# Patient Record
Sex: Female | Born: 1960 | Race: White | Hispanic: No | Smoking: Never smoker
Health system: Southern US, Community
[De-identification: ages and names within clinical notes are randomized; demographics above are authoritative.]

## PROBLEM LIST (undated history)

## (undated) DIAGNOSIS — F32A Depression, unspecified: Secondary | ICD-10-CM

## (undated) DIAGNOSIS — G4762 Sleep related leg cramps: Secondary | ICD-10-CM

## (undated) DIAGNOSIS — I10 Essential (primary) hypertension: Secondary | ICD-10-CM

## (undated) DIAGNOSIS — G709 Myoneural disorder, unspecified: Secondary | ICD-10-CM

## (undated) DIAGNOSIS — G4730 Sleep apnea, unspecified: Secondary | ICD-10-CM

## (undated) DIAGNOSIS — E039 Hypothyroidism, unspecified: Secondary | ICD-10-CM

## (undated) DIAGNOSIS — F329 Major depressive disorder, single episode, unspecified: Secondary | ICD-10-CM

## (undated) DIAGNOSIS — H269 Unspecified cataract: Secondary | ICD-10-CM

## (undated) DIAGNOSIS — E8842 MERRF syndrome: Secondary | ICD-10-CM

## (undated) DIAGNOSIS — R55 Syncope and collapse: Secondary | ICD-10-CM

## (undated) DIAGNOSIS — K219 Gastro-esophageal reflux disease without esophagitis: Secondary | ICD-10-CM

## (undated) DIAGNOSIS — G40909 Epilepsy, unspecified, not intractable, without status epilepticus: Secondary | ICD-10-CM

## (undated) DIAGNOSIS — N319 Neuromuscular dysfunction of bladder, unspecified: Secondary | ICD-10-CM

## (undated) DIAGNOSIS — R251 Tremor, unspecified: Secondary | ICD-10-CM

## (undated) DIAGNOSIS — E785 Hyperlipidemia, unspecified: Secondary | ICD-10-CM

## (undated) DIAGNOSIS — T7840XA Allergy, unspecified, initial encounter: Secondary | ICD-10-CM

## (undated) DIAGNOSIS — M1990 Unspecified osteoarthritis, unspecified site: Secondary | ICD-10-CM

## (undated) DIAGNOSIS — G8929 Other chronic pain: Secondary | ICD-10-CM

## (undated) DIAGNOSIS — F419 Anxiety disorder, unspecified: Secondary | ICD-10-CM

## (undated) DIAGNOSIS — K760 Fatty (change of) liver, not elsewhere classified: Secondary | ICD-10-CM

## (undated) DIAGNOSIS — F482 Pseudobulbar affect: Secondary | ICD-10-CM

## (undated) DIAGNOSIS — D649 Anemia, unspecified: Secondary | ICD-10-CM

## (undated) DIAGNOSIS — M8580 Other specified disorders of bone density and structure, unspecified site: Secondary | ICD-10-CM

## (undated) DIAGNOSIS — I951 Orthostatic hypotension: Secondary | ICD-10-CM

## (undated) DIAGNOSIS — M549 Dorsalgia, unspecified: Secondary | ICD-10-CM

## (undated) DIAGNOSIS — K5790 Diverticulosis of intestine, part unspecified, without perforation or abscess without bleeding: Secondary | ICD-10-CM

## (undated) DIAGNOSIS — Z860100 Personal history of colon polyps, unspecified: Secondary | ICD-10-CM

## (undated) DIAGNOSIS — G40409 Other generalized epilepsy and epileptic syndromes, not intractable, without status epilepticus: Secondary | ICD-10-CM

## (undated) DIAGNOSIS — G250 Essential tremor: Secondary | ICD-10-CM

## (undated) DIAGNOSIS — G40309 Generalized idiopathic epilepsy and epileptic syndromes, not intractable, without status epilepticus: Secondary | ICD-10-CM

## (undated) HISTORY — PX: COLONOSCOPY: SHX174

## (undated) HISTORY — DX: Pseudobulbar affect: F48.2

## (undated) HISTORY — PX: POLYPECTOMY: SHX149

## (undated) HISTORY — PX: NERVE SURGERY: SHX1016

## (undated) HISTORY — DX: Anemia, unspecified: D64.9

## (undated) HISTORY — DX: Neuromuscular dysfunction of bladder, unspecified: N31.9

## (undated) HISTORY — PX: DEEP BRAIN STIMULATOR PLACEMENT: SHX608

## (undated) HISTORY — DX: Other chronic pain: G89.29

## (undated) HISTORY — DX: Sleep related leg cramps: G47.62

## (undated) HISTORY — DX: Hypothyroidism, unspecified: E03.9

## (undated) HISTORY — PX: TONSILLECTOMY: SUR1361

## (undated) HISTORY — DX: Major depressive disorder, single episode, unspecified: F32.9

## (undated) HISTORY — DX: Epilepsy, unspecified, not intractable, without status epilepticus: G40.909

## (undated) HISTORY — DX: Unspecified cataract: H26.9

## (undated) HISTORY — PX: SPINAL CORD STIMULATOR IMPLANT: SHX2422

## (undated) HISTORY — DX: Anxiety disorder, unspecified: F41.9

## (undated) HISTORY — DX: Hyperlipidemia, unspecified: E78.5

## (undated) HISTORY — PX: TUBAL LIGATION: SHX77

## (undated) HISTORY — DX: Sleep apnea, unspecified: G47.30

## (undated) HISTORY — DX: Personal history of colon polyps, unspecified: Z86.0100

## (undated) HISTORY — DX: Syncope and collapse: R55

## (undated) HISTORY — DX: Other specified disorders of bone density and structure, unspecified site: M85.80

## (undated) HISTORY — DX: Generalized idiopathic epilepsy and epileptic syndromes, not intractable, without status epilepticus: G40.309

## (undated) HISTORY — DX: Fatty (change of) liver, not elsewhere classified: K76.0

## (undated) HISTORY — DX: Essential tremor: G25.0

## (undated) HISTORY — DX: Tremor, unspecified: R25.1

## (undated) HISTORY — PX: ABDOMINAL HYSTERECTOMY: SUR658

## (undated) HISTORY — DX: Depression, unspecified: F32.A

## (undated) HISTORY — DX: Orthostatic hypotension: I95.1

## (undated) HISTORY — PX: UPPER GASTROINTESTINAL ENDOSCOPY: SHX188

## (undated) HISTORY — DX: Personal history of colonic polyps: Z86.010

## (undated) HISTORY — DX: Unspecified osteoarthritis, unspecified site: M19.90

## (undated) HISTORY — DX: Other generalized epilepsy and epileptic syndromes, not intractable, without status epilepticus: G40.409

## (undated) HISTORY — DX: MERRF syndrome: E88.42

## (undated) HISTORY — DX: Diverticulosis of intestine, part unspecified, without perforation or abscess without bleeding: K57.90

## (undated) HISTORY — DX: Gastro-esophageal reflux disease without esophagitis: K21.9

## (undated) HISTORY — DX: Essential (primary) hypertension: I10

## (undated) HISTORY — DX: Myoneural disorder, unspecified: G70.9

## (undated) HISTORY — DX: Allergy, unspecified, initial encounter: T78.40XA

## (undated) HISTORY — DX: Dorsalgia, unspecified: M54.9

## (undated) HISTORY — PX: CARPAL TUNNEL RELEASE: SHX101

---

## 1996-04-13 HISTORY — PX: OTHER SURGICAL HISTORY: SHX169

## 2002-12-26 ENCOUNTER — Ambulatory Visit (HOSPITAL_COMMUNITY): Admission: RE | Admit: 2002-12-26 | Discharge: 2002-12-26 | Payer: Self-pay | Admitting: Internal Medicine

## 2002-12-26 ENCOUNTER — Encounter: Payer: Self-pay | Admitting: Internal Medicine

## 2003-04-14 HISTORY — PX: GASTRIC BYPASS: SHX52

## 2003-04-25 ENCOUNTER — Inpatient Hospital Stay (HOSPITAL_COMMUNITY): Admission: EM | Admit: 2003-04-25 | Discharge: 2003-04-27 | Payer: Self-pay | Admitting: Emergency Medicine

## 2008-04-13 HISTORY — PX: PANNICULECTOMY: SUR1001

## 2008-12-19 ENCOUNTER — Encounter (INDEPENDENT_AMBULATORY_CARE_PROVIDER_SITE_OTHER): Payer: Self-pay | Admitting: *Deleted

## 2009-05-10 ENCOUNTER — Encounter: Admission: RE | Admit: 2009-05-10 | Discharge: 2009-05-10 | Payer: Self-pay | Admitting: Neurological Surgery

## 2009-05-31 ENCOUNTER — Telehealth: Payer: Self-pay | Admitting: Internal Medicine

## 2009-08-21 ENCOUNTER — Encounter: Admission: RE | Admit: 2009-08-21 | Discharge: 2009-08-21 | Payer: Self-pay | Admitting: Neurological Surgery

## 2010-05-13 NOTE — Progress Notes (Signed)
Summary: Schedule Colonoscopy  Phone Note Outgoing Call   Call placed by: Hortense Ramal CMA Duncan Dull),  May 31, 2009 10:58 AM Call placed to: Patient Summary of Call: Patient is due for a recall colonoscopy due to her strong family history of colon cancer. I have left a message for the patient to call back.  Initial call taken by: Hortense Ramal CMA Duncan Dull),  May 31, 2009 11:02 AM  Follow-up for Phone Call        I have left a message for the patient to call back. Hortense Ramal CMA Duncan Dull)  June 04, 2009 10:24 AM   Additional Follow-up for Phone Call Additional follow up Details #1::        We have not gotten a return call from patient. We will send a letter.  Additional Follow-up by: Hortense Ramal CMA Duncan Dull),  June 06, 2009 11:52 AM

## 2010-05-14 ENCOUNTER — Observation Stay (HOSPITAL_COMMUNITY)
Admission: EM | Admit: 2010-05-14 | Discharge: 2010-05-15 | Disposition: A | Discharge status: Home / Self Care | Payer: Medicaid Other | Attending: Cardiology | Admitting: Cardiology

## 2010-05-14 ENCOUNTER — Emergency Department (HOSPITAL_COMMUNITY): Payer: Medicaid Other

## 2010-05-14 DIAGNOSIS — Z9884 Bariatric surgery status: Secondary | ICD-10-CM | POA: Insufficient documentation

## 2010-05-14 DIAGNOSIS — R0989 Other specified symptoms and signs involving the circulatory and respiratory systems: Secondary | ICD-10-CM | POA: Insufficient documentation

## 2010-05-14 DIAGNOSIS — M4800 Spinal stenosis, site unspecified: Secondary | ICD-10-CM | POA: Insufficient documentation

## 2010-05-14 DIAGNOSIS — G40909 Epilepsy, unspecified, not intractable, without status epilepticus: Secondary | ICD-10-CM | POA: Insufficient documentation

## 2010-05-14 DIAGNOSIS — F329 Major depressive disorder, single episode, unspecified: Secondary | ICD-10-CM | POA: Insufficient documentation

## 2010-05-14 DIAGNOSIS — E039 Hypothyroidism, unspecified: Secondary | ICD-10-CM | POA: Insufficient documentation

## 2010-05-14 DIAGNOSIS — E785 Hyperlipidemia, unspecified: Secondary | ICD-10-CM | POA: Insufficient documentation

## 2010-05-14 DIAGNOSIS — I10 Essential (primary) hypertension: Secondary | ICD-10-CM | POA: Insufficient documentation

## 2010-05-14 DIAGNOSIS — R0609 Other forms of dyspnea: Secondary | ICD-10-CM | POA: Insufficient documentation

## 2010-05-14 DIAGNOSIS — R079 Chest pain, unspecified: Principal | ICD-10-CM | POA: Insufficient documentation

## 2010-05-14 DIAGNOSIS — G8929 Other chronic pain: Secondary | ICD-10-CM | POA: Insufficient documentation

## 2010-05-14 DIAGNOSIS — E119 Type 2 diabetes mellitus without complications: Secondary | ICD-10-CM | POA: Insufficient documentation

## 2010-05-14 DIAGNOSIS — F3289 Other specified depressive episodes: Secondary | ICD-10-CM | POA: Insufficient documentation

## 2010-05-14 LAB — CBC
HCT: 34.6 % — ABNORMAL LOW (ref 36.0–46.0)
Hemoglobin: 11.6 g/dL — ABNORMAL LOW (ref 12.0–15.0)
MCHC: 33.5 g/dL (ref 30.0–36.0)
RBC: 3.67 MIL/uL — ABNORMAL LOW (ref 3.87–5.11)
WBC: 5.5 10*3/uL (ref 4.0–10.5)

## 2010-05-14 LAB — BASIC METABOLIC PANEL
CO2: 26 mEq/L (ref 19–32)
Chloride: 107 mEq/L (ref 96–112)
Glucose, Bld: 111 mg/dL — ABNORMAL HIGH (ref 70–99)
Potassium: 4.5 mEq/L (ref 3.5–5.1)
Sodium: 142 mEq/L (ref 135–145)

## 2010-05-14 LAB — DIFFERENTIAL
Basophils Absolute: 0 10*3/uL (ref 0.0–0.1)
Lymphocytes Relative: 23 % (ref 12–46)
Monocytes Absolute: 0.4 10*3/uL (ref 0.1–1.0)
Neutro Abs: 3.7 10*3/uL (ref 1.7–7.7)

## 2010-05-14 LAB — TROPONIN I: Troponin I: 0.02 ng/mL (ref 0.00–0.06)

## 2010-05-14 LAB — POCT CARDIAC MARKERS: CKMB, poc: <1 ng/mL — ABNORMAL LOW (ref 1.0–8.0)

## 2010-05-14 LAB — CK TOTAL AND CKMB (NOT AT ARMC): CK, MB: 1.2 ng/mL (ref 0.3–4.0)

## 2010-05-15 ENCOUNTER — Inpatient Hospital Stay (HOSPITAL_COMMUNITY): Payer: Medicaid Other

## 2010-05-15 DIAGNOSIS — R079 Chest pain, unspecified: Secondary | ICD-10-CM

## 2010-05-15 LAB — BASIC METABOLIC PANEL
Calcium: 9 mg/dL (ref 8.4–10.5)
Creatinine, Ser: 0.73 mg/dL (ref 0.4–1.2)
GFR calc Af Amer: >60 mL/min (ref >60)
GFR calc non Af Amer: >60 mL/min (ref >60)
Sodium: 142 mEq/L (ref 135–145)

## 2010-05-15 LAB — CBC
Hemoglobin: 11.3 g/dL — ABNORMAL LOW (ref 12.0–15.0)
Platelets: 159 10*3/uL (ref 150–400)
RBC: 3.56 MIL/uL — ABNORMAL LOW (ref 3.87–5.11)

## 2010-05-15 LAB — LIPID PANEL
Cholesterol: 216 mg/dL — ABNORMAL HIGH (ref 0–200)
HDL: 35 mg/dL — ABNORMAL LOW (ref >39)
LDL Cholesterol: 127 mg/dL — ABNORMAL HIGH (ref 0–99)
Total CHOL/HDL Ratio: 6.2 RATIO

## 2010-05-15 LAB — D-DIMER, QUANTITATIVE: D-Dimer, Quant: <0.22 ug/mL-FEU (ref 0.00–0.48)

## 2010-05-15 MED ORDER — TECHNETIUM TC 99M TETROFOSMIN IV KIT
30.0000 | PACK | Freq: Once | INTRAVENOUS | Status: AC | PRN
Start: 2010-05-15 — End: 2010-05-15
  Administered 2010-05-15: 30 via INTRAVENOUS

## 2010-05-15 MED ORDER — TECHNETIUM TC 99M TETROFOSMIN IV KIT
10.0000 | PACK | Freq: Once | INTRAVENOUS | Status: AC | PRN
  Administered 2010-05-15: 10 via INTRAVENOUS

## 2010-05-16 ENCOUNTER — Inpatient Hospital Stay (HOSPITAL_COMMUNITY): Payer: Self-pay

## 2010-06-20 NOTE — Discharge Summary (Signed)
NAMEZERAH, HILYER               ACCOUNT NO.:  [id number]  MEDICAL RECORD NO.:  [id number]           PATIENT TYPE:  I  LOCATION:  2020                         FACILITY:  [facility]  PHYSICIAN:  Pricilla Riffle, MD, FACCDATE OF BIRTH:  October 15, 1960  DATE OF ADMISSION:  05/14/2010 DATE OF DISCHARGE:  05/15/2010                              DISCHARGE SUMMARY   PRIMARY CARDIOLOGIST:  Rollene Rotunda, MD, [facility]  PRIMARY CARE PROVIDER:  Tera Mater. [place] Martin, MD  DISCHARGE DIAGNOSIS:  Chest pain without objective evidence of ischemia.  SECONDARY DIAGNOSES: 1. History of normal coronary arteries by catheterization 2005. 2. Seizure disorder. 3. History of obesity status post a gastric bypass. 4. Diabetes mellitus. 5. Hyperlipidemia. 6. Depression. 7. History of chronic back pain and spinal stenosis. 8. Hypothyroidism. 9. History of palpitations. 10.History of hypertension. 11.History of neck fusion. 12.Status post hysterectomy and tubal ligation.  ALLERGIES:  SULFA and STATINS.  PROCEDURES:  Lexiscan Myoview showing EF of 69% with probable normal perfusion and soft tissue attenuation.  Overall, low-risk scan.  HISTORY OF PRESENT ILLNESS:  A 50 year old female with the above problem list who presented to the [facility] ED on May 14, 2010, secondary to an episode of retrosternal chest pain radiating to her neck and jaw on the right side lasting about 40 minutes and resolving with 2 sublingual nitroglycerin tablets.  She was seen in the [facility] ED where enzymes were negative and EKG showed no acute changes.  She was admitted for further evaluation.  HOSPITAL COURSE:  The patient ruled out for MI.  She had no further chest pain.  She does have chronic back pain.  She underwent Lexiscan Myoview this morning, which is low risk without evidence for ischemia. The patient will be discharged home today in good condition.  DISCHARGE LABORATORY DATA:  Hemoglobin 11.3, hematocrit 33.4, WBC  4.7, platelets 159.  Sodium 142, potassium 4.3, chloride 107, CO2 of 28, BUN 13, creatinine 0.73, glucose 89, calcium 9.0, hemoglobin A1c 6.2, CK 27, MB 1.2, troponin I 0.02, total cholesterol 216, triglycerides 278, HDL 35, LDL 127.  DISPOSITION:  The patient will be discharged home today in good condition.  FOLLOWUP PLANS AND APPOINTMENTS:  She does have a followup with Dr. Evlyn Kanner in the next 1-2 weeks.  DISCHARGE MEDICATIONS: 1. Zantac 150 mg b.i.d. 2. Carbamazepine 200 mg b.i.d. 3. Cyclobenzaprine 10 mg at bedtime. 4. Cymbalta 90 mg q.p.m. 5. Endocet 2 tablets at bedtime. 6. Iron over the counter 1 tablet b.i.d. 7. Levothyroxine 100 mcg daily. 8. Lotrisone cream daily. 9. Methadone 10 mg 2 tablets b.i.d. 10.Nadolol 40 mg daily. 11.Nitroglycerin p.r.n. 12.Pristiq XR 100 mg daily. 13.Valacyclovir 500 mg q.p.m. 14.Ventolin inhaler 120 mcg per inhalation 2 puffs q.6 h. p.r.n. 15.Vitamin D2 50,000 units twice a week. 16.Zolpidem 10 mg every 2 days as needed.  OUTSTANDING LABORATORY STUDIES:  None.  DURATION OF DISCHARGE ENCOUNTER:  40 minutes including physician time.     Nicolasa Ducking, ANP   ______________________________ Pricilla Riffle, MD, [facility]    CB/MEDQ  D:  05/15/2010  T:  05/16/2010  Job:  [id number]  cc:  Tera Mater. Evlyn Kanner, M.D.  Electronically Signed by Nicolasa Ducking ANP on 05/16/2010 04:23:34 PM Electronically Signed by Dietrich Pates MD [facility] on 06/19/2010 02:11:11 PM

## 2010-06-26 NOTE — H&P (Signed)
[name], [name]               ACCOUNT NO.:  [id number]  MEDICAL RECORD NO.:  [id number]           PATIENT TYPE:  E  LOCATION:  [facility]                         FACILITY:  [facility]  PHYSICIAN:  Rollene Rotunda, MD, FACCDATE OF BIRTH:  1960/08/31  DATE OF ADMISSION:  05/14/2010 DATE OF DISCHARGE:                             HISTORY & PHYSICAL   PRIMARY CARDIOLOGIST:  Rollene Rotunda, MD, [facility]  PRIMARY CARE PHYSICIAN:  Tera Mater. [place] Martin, MD  CHIEF COMPLAINT:  Chest pain.  HISTORY OF PRESENT ILLNESS:  This is a 50 year old female with a history of dyslipidemia, early coronary artery disease in her family who had a cardiac catheterization in 2005 that showed normal coronary arteries and since that time has undergone gastric bypass surgery and lost 125 pounds.  The patient now has her diabetes under control as well as her hypertension under control.  She states since that time she has been doing well until approximately 3 months ago when she had onset of retrosternal chest pain lasting approximately 1 hour, it was relieved on its own.  There were no associated symptoms of nausea/vomiting or diaphoresis.  The patient has had several episodes of chest pain since that time occurring at rest, this lasted several minutes and relieved on its own.    On day of admission, the patient was sitting at the computer this morning when she began to have retrosternal chest pain radiating to her right shoulder and right jaw.  The patient began to get dressed and her pain worsened. She took 2 sublingual nitroglycerins without relief.  On the way to the emergency department, her pain subsided.  She is currently chest pain- free.  Of note, the patient was complaining of palpitations yesterday, this is not a new symptom but the patient states that it has been a while since she has felt any palpitations.  The patient denies any associated symptoms of nausea/vomiting, diaphoresis or shortness of breath.  She  denies any recent fever/chills, change in bowel habits or change in appetite.  Cardiology was asked to evaluate the patient for further recommendations.  PAST MEDICAL HISTORY: 1. Abnormal Cardiolite status post cardiac catheterization in 2005     demonstrating normal coronaries with an ejection fraction of 70%. 2. Seizure disorder. 3. History of diabetes mellitus, diet controlled. 4. Dyslipidemia. 5. Depression. 6. Chronic pain. 7. Spinal stenosis. 8. Palpitations. 9. Hypertension. 10.Hypothyroidism. 11.Positive sedentary lifestyle 12.Gastric bypass surgery in 2005. 13.Status post neck fusion. 14.Status post hysterectomy.  SOCIAL HISTORY:  The patient lives in [place].  She is disabled secondary to her back.  She denies any tobacco use.  She rarely consumes alcohol.  She denies any illicit drug use.  She does not exercise currently.  FAMILY HISTORY:  Pertinent for early coronary artery disease.  Her mother had coronary artery bypass grafting at age 21.  Her father had an initial myocardial infarction at the age of 39 and passed away at the age of 22.  She has two sisters without known coronary artery disease but cholesterol issues.  ALLERGIES: 1. SULFA. 2. STATINS causing headache.  HOME MEDICATIONS: 1. Nitroglycerin 0.4 mg spray. 2.  Nadolol 40 mg daily. 3. Zolpidem 10 mg as needed for sleep. 4. Endocet 5/325 mg 3 times a day. 5. Methadone 20 mg twice daily. 6. Cymbalta 90 mg daily. 7. Pristiq 100 mg daily. 8. Valcyclovir 500 mg daily. 9. Epitol 200 mg twice daily. 10.Cyclobenzaprine 10 mg twice daily. 11.Synthroid 100 mcg daily.  REVIEW OF SYSTEMS:  All pertinent positives and negatives as stated in HPI.  All other systems have been reviewed and are negative.  CODE STATUS:  Full.  PHYSICAL EXAMINATION:  VITAL SIGNS:  Temperature 98.5, pulse 68, respirations 20, blood pressure 107/61, and O2 saturation 99% on room air. GENERAL:  This is a polite,  well-developed, well-nourished middle-aged female.  She is in no acute distress. HEENT:  Normal. NECK:  Supple without bruit or JVD. HEART:  Regular rate and rhythm with S1-S2.  No murmur, rub, or gallop noted.  PMI is normal.  Pulses are 2+ and equal bilaterally. LUNGS:  Clear to auscultation bilaterally without wheezes, rales, or rhonchi. ABDOMEN:  Soft, nontender, positive bowel sounds x4. EXTREMITIES:  No clubbing, cyanosis, or edema.  There is no calf tenderness or redness bilaterally. MUSCULOSKELETAL:  No joint deformities or effusions. NEURO:  Alert and oriented x3, cranial nerves II through XII grossly intact.  IMAGING:  Chest x-ray showing no acute cardiopulmonary process.  EKG showing normal sinus rhythm at a rate of 68 beats per minute.  Axis is normal.  Intervals are normal.  There are no acute ST-T wave changes.  LABORATORY DATA:  WBC of 5.5, hemoglobin 11.6, hematocrit 34.6, and platelets 176.  Sodium 142, potassium 4.5, chloride 107, bicarb 26, BUN 16, and creatinine 0.66.  Point-of-care markers negative x1.  ASSESSMENT AND PLAN:  This is a 50 year old female with normal coronary arteries per cardiac catheterization in 2005 who presents with chest pain has atypical greater than typical features.  Her cardiac risk factors do include a positive family history for early coronary artery disease, sedentary lifestyle, and dyslipidemia.  Her EKG is without acute changes and her initial point-of-care markers are negative x1. The patient will be admitted to telemetry and we will cycle enzymes to rule out for myocardial infarction.  She will be continued on nadolol, and aspirin will be added.  No statin will be initiated at this time secondary to intolerance and further cholesterol management will be deferred to her endocrinologist, Dr. Evlyn Kanner.  We will initiate heparin at this time unless enzymes become positive.  If cardiac enzymes remain negative, we will proceed with  Lexiscan Myoview in the morning.  Cardiac enzymes become positive and we will plan for cardiac catheterization. Further treatment will be dependent upon the above.     Leonette Monarch, PA-C   ______________________________ Rollene Rotunda, MD, [facility]    NB/MEDQ  D:  05/14/2010  T:  05/15/2010  Job:  [id number]  cc:   Rollene Rotunda, MD, [facility] Tera Mater. Evlyn Kanner, M.D.  Electronically Signed by Alen Blew P.A. on 05/20/2010 06:29:15 AM Electronically Signed by Rollene Rotunda MD [facility] on 06/26/2010 09:52:09 AM

## 2010-08-12 ENCOUNTER — Telehealth: Payer: Self-pay | Admitting: Internal Medicine

## 2010-08-12 NOTE — Telephone Encounter (Signed)
Appt made for pt to see Amy Esterwood PA 08/14/10@11am . Malachi Bonds to notify pt of appt date and time. Pt having problems with diarrhea and dysphagia. Malachi Bonds to fax records. Pt was due for colon last year and did not schedule.

## 2010-08-14 ENCOUNTER — Ambulatory Visit (INDEPENDENT_AMBULATORY_CARE_PROVIDER_SITE_OTHER): Payer: Medicaid Other | Admitting: Physician Assistant

## 2010-08-14 ENCOUNTER — Telehealth: Payer: Self-pay | Admitting: Physician Assistant

## 2010-08-14 ENCOUNTER — Encounter: Payer: Self-pay | Admitting: Physician Assistant

## 2010-08-14 ENCOUNTER — Other Ambulatory Visit: Payer: Medicaid Other

## 2010-08-14 DIAGNOSIS — R1013 Epigastric pain: Secondary | ICD-10-CM

## 2010-08-14 DIAGNOSIS — Z9884 Bariatric surgery status: Secondary | ICD-10-CM

## 2010-08-14 DIAGNOSIS — F329 Major depressive disorder, single episode, unspecified: Secondary | ICD-10-CM

## 2010-08-14 DIAGNOSIS — J45909 Unspecified asthma, uncomplicated: Secondary | ICD-10-CM

## 2010-08-14 DIAGNOSIS — R1310 Dysphagia, unspecified: Secondary | ICD-10-CM

## 2010-08-14 DIAGNOSIS — G40909 Epilepsy, unspecified, not intractable, without status epilepticus: Secondary | ICD-10-CM

## 2010-08-14 DIAGNOSIS — R197 Diarrhea, unspecified: Secondary | ICD-10-CM

## 2010-08-14 DIAGNOSIS — E785 Hyperlipidemia, unspecified: Secondary | ICD-10-CM

## 2010-08-14 DIAGNOSIS — G4730 Sleep apnea, unspecified: Secondary | ICD-10-CM

## 2010-08-14 DIAGNOSIS — M4800 Spinal stenosis, site unspecified: Secondary | ICD-10-CM

## 2010-08-14 DIAGNOSIS — E119 Type 2 diabetes mellitus without complications: Secondary | ICD-10-CM

## 2010-08-14 LAB — CBC WITH DIFFERENTIAL/PLATELET
Basophils Relative: 1 % (ref 0–1)
Eosinophils Absolute: 0.2 10*3/uL (ref 0.0–0.7)
HCT: 34.9 % — ABNORMAL LOW (ref 36.0–46.0)
Hemoglobin: 11.8 g/dL — ABNORMAL LOW (ref 12.0–15.0)
MCH: 32 pg (ref 26.0–34.0)
MCHC: 33.8 g/dL (ref 30.0–36.0)
Monocytes Absolute: 0.4 10*3/uL (ref 0.1–1.0)
Monocytes Relative: 7 % (ref 3–12)

## 2010-08-14 MED ORDER — PEG-KCL-NACL-NASULF-NA ASC-C 100 G PO SOLR: 1.0000 | Freq: Once | ORAL | Status: AC

## 2010-08-14 MED ORDER — HYOSCYAMINE SULFATE 0.125 MG SL SUBL: 0.1250 mg | SUBLINGUAL_TABLET | Freq: Three times a day (TID) | SUBLINGUAL | Status: DC

## 2010-08-14 NOTE — Telephone Encounter (Signed)
I called the pharmacy and they just got my order for the Moviprep and the Levsin SL. I verified I had the correct information.

## 2010-08-14 NOTE — Patient Instructions (Signed)
Please go to the basement level to have your labs drawn and stool studies. We scheduled the Ultrasound. Directions provided. We scheduled the Endoscopy and Colonoscopy with Dr. Yancey Flemings with Propofol sedation. We have given you samples of Nexium 40mg . Take 1 daily 30 min before breakfast. We sent prescriptions for the colonoscopy prep and Levsin sublinguel for cramping and spasms to [facility].

## 2010-08-15 ENCOUNTER — Telehealth: Payer: Self-pay

## 2010-08-15 ENCOUNTER — Encounter: Payer: Self-pay | Admitting: Physician Assistant

## 2010-08-15 DIAGNOSIS — Z9884 Bariatric surgery status: Secondary | ICD-10-CM | POA: Insufficient documentation

## 2010-08-15 DIAGNOSIS — G40909 Epilepsy, unspecified, not intractable, without status epilepticus: Secondary | ICD-10-CM | POA: Insufficient documentation

## 2010-08-15 DIAGNOSIS — E119 Type 2 diabetes mellitus without complications: Secondary | ICD-10-CM | POA: Insufficient documentation

## 2010-08-15 DIAGNOSIS — G4730 Sleep apnea, unspecified: Secondary | ICD-10-CM | POA: Insufficient documentation

## 2010-08-15 DIAGNOSIS — F32A Depression, unspecified: Secondary | ICD-10-CM | POA: Insufficient documentation

## 2010-08-15 DIAGNOSIS — M4800 Spinal stenosis, site unspecified: Secondary | ICD-10-CM | POA: Insufficient documentation

## 2010-08-15 DIAGNOSIS — E785 Hyperlipidemia, unspecified: Secondary | ICD-10-CM | POA: Insufficient documentation

## 2010-08-15 DIAGNOSIS — F329 Major depressive disorder, single episode, unspecified: Secondary | ICD-10-CM | POA: Insufficient documentation

## 2010-08-15 DIAGNOSIS — J45909 Unspecified asthma, uncomplicated: Secondary | ICD-10-CM | POA: Insufficient documentation

## 2010-08-15 NOTE — Telephone Encounter (Signed)
Message copied by Chrystie Nose on Fri Aug 15, 2010  2:11 PM ------      Message from: [place], [place]      Created: Fri Aug 15, 2010  1:51 PM       Please let Kasmira know her labs are fine hgb is 11.8  Which is stable

## 2010-08-15 NOTE — Progress Notes (Signed)
Reviewed and agree with management. Rishav Rockefeller T. Tache Bobst MD FACG 

## 2010-08-15 NOTE — Telephone Encounter (Signed)
Pt aware of results per Amy Esterwood PA. 
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
## 2010-08-18 ENCOUNTER — Ambulatory Visit (HOSPITAL_COMMUNITY)
Admission: RE | Admit: 2010-08-18 | Discharge: 2010-08-18 | Disposition: A | Discharge status: Home / Self Care | Payer: Medicaid Other | Source: Ambulatory Visit | Attending: Physician Assistant | Admitting: Physician Assistant

## 2010-08-18 DIAGNOSIS — R1013 Epigastric pain: Secondary | ICD-10-CM

## 2010-08-18 DIAGNOSIS — R1310 Dysphagia, unspecified: Secondary | ICD-10-CM

## 2010-08-18 DIAGNOSIS — Q638 Other specified congenital malformations of kidney: Secondary | ICD-10-CM | POA: Insufficient documentation

## 2010-08-19 ENCOUNTER — Other Ambulatory Visit: Payer: Medicaid Other

## 2010-08-19 ENCOUNTER — Telehealth: Payer: Self-pay

## 2010-08-19 DIAGNOSIS — R1310 Dysphagia, unspecified: Secondary | ICD-10-CM

## 2010-08-19 DIAGNOSIS — R1013 Epigastric pain: Secondary | ICD-10-CM

## 2010-08-19 DIAGNOSIS — R197 Diarrhea, unspecified: Secondary | ICD-10-CM

## 2010-08-19 NOTE — Telephone Encounter (Signed)
Message copied by Chrystie Nose on Tue Aug 19, 2010  4:09 PM ------      Message from: [place], [place]      Created: Tue Aug 19, 2010  4:09 PM       PLEASE LET PT KNOW THE ABDOMINAL US IS NEGATIVE, NO GALLSTONES.

## 2010-08-19 NOTE — Telephone Encounter (Signed)
Pt aware of results per Amy Esterwood PA. 

## 2010-08-20 LAB — FECAL LACTOFERRIN, QUANT: Lactoferrin: POSITIVE

## 2010-08-26 ENCOUNTER — Telehealth: Payer: Self-pay | Admitting: *Deleted

## 2010-08-26 NOTE — Telephone Encounter (Signed)
Patient given results of stool cultures. She states she thought she was doing better but she had an episode last night after she ate chili.

## 2010-08-26 NOTE — Telephone Encounter (Signed)
Message copied by Jesse Fall on Tue Aug 26, 2010  8:46 AM ------      Message from: [place], [place]      Created: Mon Aug 25, 2010 10:29 AM       plese make sure pt knows that all of the stool cultures are negative,see how she is feeling

## 2010-08-29 NOTE — H&P (Signed)
NAME:  [name], [name]:  [id number]   MEDICAL RECORD NO.:  [id number]                   PATIENT TYPE:  INP   LOCATION:  2906                                 FACILITY:  [facility]   PHYSICIAN:  Willa Rough, M.D.                  DATE OF BIRTH:  1960-09-05   DATE OF ADMISSION:  04/25/2003  DATE OF DISCHARGE:                                HISTORY & PHYSICAL   [name] is a very pleasant 50 year old female who is admitted with  chest pain.  She has diabetes, and her endocrinologist is Dr. Ardyth Harps  here in [place].  The patient has a very strong family history of heart  disease.  Her father was cared for by Dr. Antoine Poche of our group, until he  passed away earlier this year.  The patient does have diabetes,  hypertension, and elevated cholesterol along with her family history.  A  Cardiolite scan had been ordered by Dr. Corliss Blacker in the [place] area  recently, and we are told that it did show some reversibility in the  anterior wall with an ejection fraction of 63%.  In addition, the patient  has had some chest discomfort.  Therefore, she chose to come here for her  cardiology care.  She is now here for evaluation.   PAST MEDICAL HISTORY:   ALLERGIES:  The patient develops difficulties with SULFA.  I am not sure  that it is a true allergy, but it will be treated as an allergy.   MEDICATIONS:  1. Tegretol 200 b.i.d.  2. Metformin 1000 b.i.d.  3. Vitorin 10/40.  4. Cozaar 50.  5. Lasix 10.  6. Effexor 75 mg ? 3 q.h.s.  7. Ambien 10.  8. Aspirin 81.  9. CPAP.   OTHER MEDICAL HISTORY:  See the complete list below.   SOCIAL HISTORY:  The patient is an [employer] fundraiser.  She oversees [employer] in [place].  She lives with her husband.  She does not smoke.   FAMILY HISTORY:  There is a strong family history of coronary disease.  Her  father died with heart disease at age 22, had an MI at age 92.  Mother had  CABG at age 40.   REVIEW  OF SYSTEMS:  She has not been having any fevers or chills.  She has  no HEENT problems.  There are no major skin problems.  She has chest pain  today as mentioned.  She has no GU or GI problems.  There are no major  musculoskeletal problems.  She had a negative endoscopy in 2004.  The  patient has back spasms intermittently, and she is being bothered by one now  in the emergency room bed.  She uses Skelaxin 800 mg b.i.d. when she needs  it.  The remainder of her Review of Systems is negative.   PHYSICAL EXAMINATION:  VITAL SIGNS:  Temperature  98, pulse 100, respirations  20, blood pressure 140/82.  GENERAL:  The patient is pleasant, obese, 50 year old female.  HEENT:  No marked abnormalities.  NECK:  No bruits.  CARDIAC:  Reveals an S1 with an S2 but no clicks or significant murmurs.  LUNGS:  Clear.  SKIN:  No rashes.  ABDOMEN:  Protuberant but soft.  GU/RECTAL:  Deferred.  EXTREMITIES:  She has good distal pulses with no edema.  There are no  musculoskeletal deformities.  NEUROLOGIC:  Grossly intact.   LABORATORY DATA:  EKG reveals no acute change.   Chest x-ray is pending.   Other blood labs are also pending.   PROBLEM LIST:  1. The patient has back spasms intermittently, and she is being bothered by     one now in the emergency room bed.  She uses Skelaxin 800 mg b.i.d. when     she needs it.  2. 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  3. Depression for which she is on medication.  4. Question of asthma.  5. Hypertension.  6. Diabetes.  7. Elevated cholesterol.  8. Palpitations.  9. Obstructive sleep apnea for which she is on CPAP.  10.      * Chest discomfort.  With her risk factors, her current pain is     quite concerning.  By report, there was some reversibility in her     anterior wall suggesting some possible anterior ischemia by recent     Cardiolite done in [place].  Ejection fraction was normal.   The patient is being  stabilized with her medications. She will undergo  cardiac catheterization on April 26, 2003.  This will be done sooner if  she has any difficulties tonight.  I have spoken with Dr. Jens Som about  her, and he is on call tonight.                                                Willa Rough, M.D.    Cleotis Lema  D:  04/25/2003  T:  04/25/2003  Job:  [id number]   cc:   Rollene Rotunda, M.D.   [facility], [facility]   Perlie Gold, M.D.  [place], [place]

## 2010-08-29 NOTE — Discharge Summary (Signed)
NAME:  NEILAH, FULWIDER                         ACCOUNT NO.:  [id number]   MEDICAL RECORD NO.:  [id number]                   PATIENT TYPE:  INP   LOCATION:  2906                                 FACILITY:  [facility]   PHYSICIAN:  Arturo Morton. Riley Kill, M.D.             DATE OF BIRTH:  1960/12/08   DATE OF ADMISSION:  04/25/2003  DATE OF DISCHARGE:  04/27/2003                                 DISCHARGE SUMMARY   PROCEDURE:  1. Cardiac catheterization.  2. Coronary arteriogram.  3. Left ventriculogram.   HOSPITAL COURSE:  Ms. Vilinda Boehringer is a 50 year old female with no known history  of coronary artery disease. Her cardiac risk factors include diabetes and  strong family history of premature coronary artery disease. She had a  Cardiolite ordered by her primary care physician in [place], and it showed  some reversibility in the anterior wall with an EF of 53%. She came to the  hospital and was admitted for further evaluation and treatment.   Her enzymes were negative for MI, and she had a cardiac catheterization on  April 27, 2003. The cardiac catheterization showed angiographically normal  coronaries with a normal left ventricular and diastolic pressure and an EF  of 70% without regional wall motion abnormalities. There was no AS or MR.  She has no history of anemia, and her hemoglobin was 9.8 upon admission, but  she denied any dizziness or syncope. She also denied any history of melena,  hematemesis, or hemoptysis. She has some gastroesophageal reflux disease  symptoms but has had an EGD and a colonoscopy that did not show any  abnormalities. It was therefore decided that she could follow up with her  primary care physician in the [place] area as well as with her  endocrinologist, Dr. Evlyn Kanner. She was considered stable for discharge on  April 27, 2003 with outpatient followup arranged.   LABORATORY DATA:  Hemoglobin 9.8, hematocrit 29.3, WBCs 11.6, platelets 290.  Sodium 141, potassium  3.8, chloride 108, CO2 26, BUN 9, creatinine 0.6,  glucose 100. Albumin 3.2, total bilirubin 0.2.   Chest x-ray:  Cardiomegaly and pulmonary vascular congestion with mild  chronic interstitial lung disease and possible mild interstitial pulmonary  edema.   DISCHARGE CONDITION:  Improved.   DISCHARGE DIAGNOSES:  1. Chest pain, positive Cardiolite, but angiographically normal coronaries,     followup with primary care physician.  2. Type 2 diabetes.  3. Hypertension.  4. Hyperlipidemia.  5. Depression.  6. History of seizure disorder.  7. History of palpitations with no critical arrhythmia on Holter monitor.  8. Status post neck fusion and tubal ligation.  9. History of use of CPAP.  10.      History of allergies to SULFA.  11.      Family history of premature coronary artery disease with both     parents needing bypass surgery or having MI before the age of 55.  DISCHARGE INSTRUCTIONS:  Activity level was to include no driving or  strenuous activity for two days. She is to stick to a low fat diabetic diet.  She is to call the office with problems with the catheterization site. She  is to follow up with Dr. Uvaldo Rising and Lazaro Arms, P.A., next week. She is to  follow up with Dr. Antoine Poche next week as week. She is to follow up with Dr.  Evlyn Kanner as needed.   DISCHARGE MEDICATIONS:  1. Tegretol 200 mg b.i.d.  2. Metformin 1,000 mg b.i.d., restart April 30, 2003 and hold until then.  3. Vytorin 10 mg/40 mg q.d.  4. Cozaar 50 mg q.d.  5. Lasix 10 mg q.d.  6. Effexor 75 mg three tablets q.d.  7. Ambien q.h.s. p.r.n.  8. Aspirin 81 mg q.d.  9. Albuterol MDI p.r.n.  10.      CPAP as prior to admission.      Theodore Demark, P.A. [facility]                  Thomas D. Riley Kill, M.D.    RB/MEDQ  D:  04/27/2003  T:  04/28/2003  Job:  [id number]   cc:   Corliss Blacker, M.D.   Lazaro Arms, P.A.  [facility]  [place], [place]   Jeannett Senior A. Evlyn Kanner, M.D.  [address]   [place]  [place] [address]  Fax: [phone]   Rollene Rotunda, M.D.

## 2010-08-29 NOTE — Cardiovascular Report (Signed)
NAME:  [name], [name]                         ACCOUNT NO.:  [id number]   MEDICAL RECORD NO.:  [id number]                   PATIENT TYPE:  INP   LOCATION:  2906                                 FACILITY:  [facility]   PHYSICIAN:  Salvadore Farber, M.D.             DATE OF BIRTH:  1960-05-01   DATE OF PROCEDURE:  04/27/2003  DATE OF DISCHARGE:                              CARDIAC CATHETERIZATION   PROCEDURE:  Coronary angiography, left heart catheterization, left  ventriculography.   INDICATIONS:  [name] is a 50 year old lady with diabetes mellitus,  hypertension, dyslipidemia, and a family history of premature  atherosclerotic disease.  She presents with chest pain.  Recent exercise  test reportedly demonstrated evidence of anterior ischemia.  She then  subsequently presented to the emergency room with chest discomfort and was  admitted.  She was ruled out for myocardial infarction by serial  electrocardiograms and EKGs.  She is now referred for diagnostic angiography  and ventriculography.   PROCEDURAL TECHNIQUE:  Informed consent was obtained.  Under 1% lidocaine  local anesthesia a 6-French sheath was placed in the right femoral artery  using the modified Seldinger technique.  Diagnostic angiography and  ventriculography were performed using JL4, JR4, and pigtail catheters.  The  patient tolerated the procedure well.  Was transferred to the holding room  in stable condition.  Sheaths are to be removed there.  Total contrast  utilized was 58 mL.   COMPLICATIONS:  None.   FINDINGS:  1. LV 101/0/12.  EF 70% without regional wall motion abnormality.  2. No aortic stenosis or mitral regurgitation.  3. Left main:  Angiographically normal.  4. LAD:  The LAD is a large vessel which wraps the apex of the heart and     gives rise to a single large diagonal.  It is angiographically normal.  5. Ramus intermedius:  Large vessel which is angiographically normal.  6. Circumflex:   Relatively small vessel giving rise to two obtuse marginals     which is angiographically normal.  7. RCA:  Moderate sized, dominant vessel which is angiographically normal.   IMPRESSION/RECOMMENDATIONS:  1. Angiographically normal coronary arteries.  2. Normal left ventricular end-diastolic pressure.  3. Normal left ventricular size and systolic function.  4. No aortic stenosis or mitral regurgitation.   Normal coronary arteries.  Will discharge patient home for outpatient  evaluation of noncardiac etiologies for her chest discomfort.                                               Salvadore Farber, M.D.    WED/MEDQ  D:  04/27/2003  T:  04/27/2003  Job:  [id number]   cc:   Jeannett Senior A. Evlyn Kanner, M.D.  [address]  [place]  [place] [address]  Fax: [phone]  Perlie Gold, M.D.  Basilia Jumbo, M.D.

## 2010-09-09 ENCOUNTER — Encounter: Payer: Self-pay | Admitting: Internal Medicine

## 2010-09-09 ENCOUNTER — Ambulatory Visit (AMBULATORY_SURGERY_CENTER): Payer: Medicaid Other | Admitting: Internal Medicine

## 2010-09-09 VITALS — BP 124/53 | HR 67 | Temp 97.9°F | Resp 20 | Ht 61.0 in | Wt 140.0 lb

## 2010-09-09 DIAGNOSIS — R197 Diarrhea, unspecified: Secondary | ICD-10-CM

## 2010-09-09 DIAGNOSIS — D126 Benign neoplasm of colon, unspecified: Secondary | ICD-10-CM

## 2010-09-09 DIAGNOSIS — R1310 Dysphagia, unspecified: Secondary | ICD-10-CM

## 2010-09-09 DIAGNOSIS — K5730 Diverticulosis of large intestine without perforation or abscess without bleeding: Secondary | ICD-10-CM

## 2010-09-09 DIAGNOSIS — Z1211 Encounter for screening for malignant neoplasm of colon: Secondary | ICD-10-CM

## 2010-09-09 LAB — GLUCOSE, CAPILLARY: Glucose-Capillary: 89 mg/dL (ref 70–99)

## 2010-09-09 MED ORDER — SODIUM CHLORIDE 0.9 % IV SOLN: 500.0000 mL | INTRAVENOUS | Status: DC

## 2010-09-10 ENCOUNTER — Telehealth: Payer: Self-pay

## 2010-09-10 NOTE — Telephone Encounter (Signed)

## 2010-10-09 ENCOUNTER — Ambulatory Visit: Payer: Medicaid Other | Admitting: Internal Medicine

## 2010-10-09 ENCOUNTER — Encounter: Payer: Self-pay | Admitting: Internal Medicine

## 2010-10-10 ENCOUNTER — Telehealth: Payer: Self-pay | Admitting: Internal Medicine

## 2010-10-10 NOTE — Telephone Encounter (Signed)
Left message for patient to call back I have mailed her a copy of the letter also.  I have left her a message to call back to discuss.

## 2010-10-10 NOTE — Telephone Encounter (Signed)
I have reviewed the results with the patient.

## 2010-11-04 ENCOUNTER — Ambulatory Visit (INDEPENDENT_AMBULATORY_CARE_PROVIDER_SITE_OTHER): Payer: Medicaid Other | Admitting: Internal Medicine

## 2010-11-04 ENCOUNTER — Other Ambulatory Visit (INDEPENDENT_AMBULATORY_CARE_PROVIDER_SITE_OTHER): Payer: Medicaid Other

## 2010-11-04 ENCOUNTER — Encounter: Payer: Self-pay | Admitting: Internal Medicine

## 2010-11-04 VITALS — BP 94/62 | HR 68 | Ht 61.0 in | Wt 140.4 lb

## 2010-11-04 DIAGNOSIS — R197 Diarrhea, unspecified: Secondary | ICD-10-CM

## 2010-11-04 DIAGNOSIS — Z86010 Personal history of colonic polyps: Secondary | ICD-10-CM

## 2010-11-04 DIAGNOSIS — R1084 Generalized abdominal pain: Secondary | ICD-10-CM

## 2010-11-04 DIAGNOSIS — D500 Iron deficiency anemia secondary to blood loss (chronic): Secondary | ICD-10-CM

## 2010-11-04 LAB — FOLATE: Folate: 15.6 ng/mL (ref >5.9)

## 2010-11-04 NOTE — Progress Notes (Signed)
HISTORY OF PRESENT ILLNESS:  [name] is a 50 y.o. 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 She was seen by the physician assistant 08/15/2010 regarding new onset and persisting diarrhea, vague dysphagia with chest discomfort, and sharp epigastric pain. Stool studies were negative. C-reactive protein normal. CBC revealing mild normocytic anemia with a hemoglobin of 11.8. Abdominal ultrasound normal except for incidental horseshoe kidney subsequently underwent both upper endoscopy and colonoscopy on 09/09/2010. Upper endoscopy was normal status post Roux-en-Y gastric bypass. Normal esophagus. Colonoscopy revealed a diminutive colon polyp which was removed and found to be adenomatous. Random colon biopsies were normal with no evidence of microscopic colitis. She presents today for routine followup, as requested. Overall, she has been doing better. She reports just 2 episodes of short-lived loose stools since her procedures. She treated this to dietary indiscretion. To some intermittent problems with chest discomfort. For her abdominal discomfort she does state that Levsin seems to help. No other complaints or issues. She does take iron. No B12.  REVIEW OF SYSTEMS:  All non-GI ROS negative except for chronic back pain  Past Medical History  Diagnosis Date  . Seizure disorder   . Diabetes mellitus   . Hyperlipidemia   . Depression   . Chronic back pain     spinal stenosis  . Hypothyroidism   . Hypertension   . Anemia   . Anxiety   . Asthma   . Neurogenic bladder     Past Surgical History  Procedure Date  . Gastric bypass 2005  . Neck fusion   . Abdominal hysterectomy   . Tubal ligation     Social History [name]  reports that she has never smoked. She has never used smokeless tobacco. She reports that she drinks alcohol. She reports that she  does not use illicit drugs.  family history includes Colon cancer in her maternal aunt; Coronary artery disease in her sisters; Coronary artery disease (age of onset:36) in her mother; Diabetes in an unspecified family member; Heart attack (age of onset:59) in her father; Heart disease in her father and mother; and Kidney disease in her mother.  Allergies  Allergen Reactions  . Statins   . Sulfa Antibiotics        PHYSICAL EXAMINATION:  Vital signs: BP 94/62  Pulse 68  Ht 5\' 1"  (1.549 m)  Wt 140 lb 6.4 oz (63.685 kg)  BMI 26.53 kg/m2 General: Well-developed, well-nourished, no acute distress HEENT: Sclerae are anicteric, conjunctiva pink. Oral mucosa intact Lungs: Clear Heart: Regular Abdomen: soft, nontender, nondistended, no obvious ascites, no peritoneal signs, normal bowel sounds. No organomegaly. Extremities: No edema Psychiatric: alert and oriented x3. Cooperative     ASSESSMENT:  #1. Diarrhea. Most likely post infectious with an element of dysmotility. Improving. Negative colonoscopy with biopsies as described #2. Epigastric discomfort. Improving with Levsin. Negative endoscopy. Negative ultrasound #3. Intermittent chest discomfort. Being evaluated and treated by PCP. Not obviously GI #4. Adenomatous colon polyp on recent exam. #5. Normocytic anemia   PLAN:  #1. No further evaluation of diarrhea as this has essentially resolved #2. Levsin sublingual when necessary abdominal discomfort #3. B12, folate, and iron studies to further evaluate mild normocytic anemia. #4. Surveillance colonoscopy around May 2016. Interval GI followup as needed

## 2010-11-04 NOTE — Patient Instructions (Signed)
Go to basement to have labs drawn today.

## 2010-11-05 ENCOUNTER — Telehealth: Payer: Self-pay

## 2010-11-05 NOTE — Telephone Encounter (Signed)
Message copied by Michele Mcalpine on Wed Nov 05, 2010  1:19 PM ------      Message from: [name]      Created: Tue Nov 04, 2010  5:11 PM       The patient know that her B12, folate, and iron levels are normal. She should continue her iron therapy indefinitely in order to maintain normal iron levels moving forward

## 2010-11-05 NOTE — Telephone Encounter (Signed)
Pt aware.

## 2011-07-08 ENCOUNTER — Ambulatory Visit: Payer: Medicaid Other | Attending: Endocrinology | Admitting: Physical Therapy

## 2011-07-08 DIAGNOSIS — M25559 Pain in unspecified hip: Secondary | ICD-10-CM | POA: Insufficient documentation

## 2011-07-08 DIAGNOSIS — IMO0001 Reserved for inherently not codable concepts without codable children: Secondary | ICD-10-CM | POA: Insufficient documentation

## 2011-07-22 ENCOUNTER — Ambulatory Visit: Payer: Medicaid Other | Attending: Endocrinology | Admitting: Physical Therapy

## 2011-07-22 DIAGNOSIS — M25559 Pain in unspecified hip: Secondary | ICD-10-CM | POA: Insufficient documentation

## 2011-07-22 DIAGNOSIS — IMO0001 Reserved for inherently not codable concepts without codable children: Secondary | ICD-10-CM | POA: Insufficient documentation

## 2011-08-04 ENCOUNTER — Ambulatory Visit: Payer: Medicaid Other | Admitting: Physical Therapy

## 2011-08-19 ENCOUNTER — Ambulatory Visit: Payer: Medicaid Other | Attending: Endocrinology | Admitting: Physical Therapy

## 2011-08-19 DIAGNOSIS — IMO0001 Reserved for inherently not codable concepts without codable children: Secondary | ICD-10-CM | POA: Insufficient documentation

## 2011-08-19 DIAGNOSIS — M25559 Pain in unspecified hip: Secondary | ICD-10-CM | POA: Insufficient documentation

## 2011-10-13 ENCOUNTER — Other Ambulatory Visit: Payer: Self-pay | Admitting: Physician Assistant

## 2011-10-16 ENCOUNTER — Other Ambulatory Visit: Payer: Self-pay | Admitting: *Deleted

## 2011-11-13 ENCOUNTER — Other Ambulatory Visit: Payer: Self-pay | Admitting: Physician Assistant

## 2011-11-13 ENCOUNTER — Other Ambulatory Visit: Payer: Self-pay | Admitting: *Deleted

## 2012-01-21 ENCOUNTER — Other Ambulatory Visit: Payer: Self-pay | Admitting: Neurological Surgery

## 2012-01-21 DIAGNOSIS — M79606 Pain in leg, unspecified: Secondary | ICD-10-CM

## 2012-01-21 DIAGNOSIS — M4800 Spinal stenosis, site unspecified: Secondary | ICD-10-CM

## 2012-01-21 DIAGNOSIS — M542 Cervicalgia: Secondary | ICD-10-CM

## 2012-01-26 ENCOUNTER — Ambulatory Visit
Admission: RE | Admit: 2012-01-26 | Discharge: 2012-01-26 | Disposition: A | Discharge status: Home / Self Care | Payer: Medicaid Other | Source: Ambulatory Visit | Attending: Neurological Surgery | Admitting: Neurological Surgery

## 2012-01-26 VITALS — BP 97/37 | HR 51

## 2012-01-26 DIAGNOSIS — M79606 Pain in leg, unspecified: Secondary | ICD-10-CM

## 2012-01-26 DIAGNOSIS — M4800 Spinal stenosis, site unspecified: Secondary | ICD-10-CM

## 2012-01-26 DIAGNOSIS — M542 Cervicalgia: Secondary | ICD-10-CM

## 2012-01-26 MED ORDER — ONDANSETRON HCL 4 MG/2ML IJ SOLN
4.0000 mg | Freq: Once | INTRAMUSCULAR | Status: AC
  Administered 2012-01-26: 4 mg via INTRAMUSCULAR

## 2012-01-26 MED ORDER — IOHEXOL 300 MG/ML  SOLN
10.0000 mL | Freq: Once | INTRAMUSCULAR | Status: AC | PRN
  Administered 2012-01-26: 10 mL via INTRATHECAL

## 2012-01-26 MED ORDER — DIAZEPAM 5 MG PO TABS
10.0000 mg | ORAL_TABLET | Freq: Once | ORAL | Status: AC
  Administered 2012-01-26: 10 mg via ORAL

## 2012-01-26 MED ORDER — ONDANSETRON HCL 4 MG/2ML IJ SOLN: 4.0000 mg | Freq: Four times a day (QID) | INTRAMUSCULAR | Status: DC | PRN

## 2012-01-26 MED ORDER — MEPERIDINE HCL 100 MG/ML IJ SOLN
75.0000 mg | Freq: Once | INTRAMUSCULAR | Status: AC
  Administered 2012-01-26: 75 mg via INTRAMUSCULAR

## 2012-01-26 NOTE — Progress Notes (Signed)
Pt states she has been off pristig and cymbalta for the past 2 days.

## 2012-01-26 NOTE — Discharge Instructions (Signed)
Myelogram Discharge Instructions  1. Go home and rest quietly for the next 24 hours.  It is important to lie flat for the next 24 hours.  Get up only to go to the restroom.  You may lie in the bed or on a couch on your back, your stomach, your left side or your right side.  You may have one pillow under your head.  You may have pillows between your knees while you are on your side or under your knees while you are on your back.  2. DO NOT drive today.  Recline the seat as far back as it will go, while still wearing your seat belt, on the way home.  3. You may get up to go to the bathroom as needed.  You may sit up for 10 minutes to eat.  You may resume your normal diet and medications unless otherwise indicated.  Drink lots of extra fluids today and tomorrow.  4. The incidence of headache, nausea, or vomiting is about 5% (one in 20 patients).  If you develop a headache, lie flat and drink plenty of fluids until the headache goes away.  Caffeinated beverages may be helpful.  If you develop severe nausea and vomiting or a headache that does not go away with flat bed rest, call [phone].  5. You may resume normal activities after your 24 hours of bed rest is over; however, do not exert yourself strongly or do any heavy lifting tomorrow. If when you get up you have a headache when standing, go back to bed and force fluids for another 24 hours.  6. Call your physician for a follow-up appointment.  The results of your myelogram will be sent directly to your physician by the following day.  7. If you have any questions or if complications develop after you arrive home, please call [phone].  Discharge instructions have been explained to the patient.  The patient, or the person responsible for the patient, fully understands these instructions.       May resume cymbalta and pristig on January 27, 2012, after 11:00 am.

## 2012-01-28 ENCOUNTER — Telehealth: Payer: Self-pay | Admitting: Radiology

## 2012-01-28 NOTE — Telephone Encounter (Signed)
After talking to pt she is in agreement that her neck and head pain are from her original problem and not her myelogram. Headache is better after she gets up and moves around and takes her meds. She will call if this changes. Is to follow up with her physician in a few days.

## 2012-05-28 ENCOUNTER — Other Ambulatory Visit: Payer: Self-pay

## 2012-08-15 ENCOUNTER — Ambulatory Visit (INDEPENDENT_AMBULATORY_CARE_PROVIDER_SITE_OTHER): Payer: Medicare HMO | Admitting: Neurology

## 2012-08-15 ENCOUNTER — Encounter: Payer: Self-pay | Admitting: Neurology

## 2012-08-15 VITALS — BP 110/66 | HR 66 | Temp 98.1°F | Resp 12 | Ht 61.0 in | Wt 153.0 lb

## 2012-08-15 DIAGNOSIS — G40309 Generalized idiopathic epilepsy and epileptic syndromes, not intractable, without status epilepticus: Secondary | ICD-10-CM

## 2012-08-15 DIAGNOSIS — M542 Cervicalgia: Secondary | ICD-10-CM

## 2012-08-15 DIAGNOSIS — F329 Major depressive disorder, single episode, unspecified: Secondary | ICD-10-CM

## 2012-08-15 DIAGNOSIS — M62838 Other muscle spasm: Secondary | ICD-10-CM
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
## 2012-08-15 NOTE — Patient Instructions (Addendum)
Follow up in 6 weeks for acupuncture.

## 2012-09-26 ENCOUNTER — Encounter: Payer: Self-pay | Admitting: Neurology

## 2012-09-26 ENCOUNTER — Ambulatory Visit (INDEPENDENT_AMBULATORY_CARE_PROVIDER_SITE_OTHER): Payer: Medicare HMO | Admitting: Neurology

## 2012-09-26 VITALS — BP 90/60 | HR 60 | Temp 97.3°F | Resp 12 | Wt 149.0 lb

## 2012-09-26 DIAGNOSIS — K900 Celiac disease: Secondary | ICD-10-CM

## 2012-09-26 DIAGNOSIS — K9041 Non-celiac gluten sensitivity: Secondary | ICD-10-CM

## 2012-09-26 DIAGNOSIS — M549 Dorsalgia, unspecified: Secondary | ICD-10-CM

## 2012-09-26 NOTE — Progress Notes (Signed)
[name] returns for 6 week followup visit.  She has not been able to try acupuncture it yet.  She has been seen at the pain clinic and she was also ordered to have a home sleep study to make sure she doesn't still have obstructive sleep apnea.  In the meantime, she has not on a gluten-free diet. She has seen significant improvement in bowel function with decreasing for laxatives and more regularity. She has had increasing alertness and memory as well as balance function. She has had improvement of her tremors but not complete resolution of the tremors. 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  She is getting established with the pain clinic where she would like to follow as her main pain clinic source. She is also anxious and acupuncture but has not started yet. She feels that she can probably get 50% of acupuncture covered by insurance.  She is within the starting she gone classes but has not started yet.  Review of systems is positive for back and hip pain as well as neck pain she also has some word finding difficulty and the tremors.  As mentioned, her digestive function is much better on a gluten-free diet.  She is still experiencing some depression but she feels it is better and she has started counseling.  Past Medical History  Diagnosis Date  . Seizure disorder   . Diabetes mellitus   . Hyperlipidemia   . Depression   . Chronic back pain     spinal stenosis  . Hypothyroidism   . Hypertension   . Anemia   . Anxiety   . Asthma   . Neurogenic bladder   . Myoclonic epilepsy   . Tremor     Current Outpatient Prescriptions on File Prior to Visit  Medication Sig Dispense Refill  . albuterol (VENTOLIN HFA) 108 (90 BASE) MCG/ACT inhaler Inhale 2 puffs into the lungs every 6 (six) hours as needed.        Marland Kitchen b complex vitamins tablet Take 1 tablet by mouth daily.      . carbamazepine  (TEGRETOL) 200 MG tablet Take 200 mg by mouth 2 (two) times daily.        . cimetidine (TAGAMET) 400 MG tablet Take 400 mg by mouth 2 (two) times daily.      . clotrimazole-betamethasone (LOTRISONE) cream Apply topically daily.        . cyclobenzaprine (FLEXERIL) 10 MG tablet Take 10 mg by mouth at bedtime as needed.        . desvenlafaxine (PRISTIQ) 100 MG 24 hr tablet Take 100 mg by mouth daily.        . DULoxetine (CYMBALTA) 60 MG capsule Take 90 mg by mouth every evening. Taken 1 1/2 tablet by mouth every evening       . ergocalciferol (VITAMIN D2) 50000 UNITS capsule Take 50,000 Units by mouth 2 (two) times a week.        . ferrous sulfate 325 (65 FE) MG tablet Take 325 mg by mouth 2 (two) times daily.        . folic acid (FOLVITE) 400 MCG tablet Take 400 mcg by mouth 2 (two) times daily.      Marland Kitchen levothyroxine (SYNTHROID, LEVOTHROID) 100 MCG tablet Take 100 mcg by mouth daily. Take 1 tablet by mouth for 6 days of the week and 1/2 tablet on Sunday      .  methadone (DOLOPHINE) 10 MG tablet Take 20 mg by mouth every 8 (eight) hours. Take 2 tablets by mouth twice a day       . nadolol (CORGARD) 40 MG tablet Take 40 mg by mouth daily.        . nitroGLYCERIN (NITROSTAT) 0.4 MG SL tablet Place 0.4 mg under the tongue every 5 (five) minutes as needed.        . Oxycodone-Acetaminophen (ENDOCET PO) Take 2 tablets by mouth at bedtime.        . psyllium (METAMUCIL) 58.6 % powder Take 1 packet by mouth daily as needed.        . senna (SENOKOT) 8.6 MG tablet Take 1 tablet by mouth as needed.        . valACYclovir (VALTREX) 500 MG tablet Take 500 mg by mouth daily.        Marland Kitchen zolpidem (AMBIEN) 10 MG tablet Take 10 mg by mouth. Take every 2 days as needed         No current facility-administered medications on file prior to visit.   Statins and Sulfa antibiotics  History   Social History  . Marital Status: Married    Spouse Name: N/A    Number of Children: N/A  . Years of Education: N/A    Occupational History  . Disabled     her back   Social History Main Topics  . Smoking status: Never Smoker   . Smokeless tobacco: Never Used  . Alcohol Use: Yes     Comment: glass of wine a month  . Drug Use: No     Comment: none  . Sexually Active: Not on file   Other Topics Concern  . Not on file   Social History Narrative  . No narrative on file    Family History  Problem Relation Age of Onset  . Coronary artery disease Mother 23  . Heart disease Mother   . Kidney disease Mother   . Heart attack Father 81  . Heart disease Father   . Coronary artery disease Sister   . Coronary artery disease Sister   . Colon cancer Maternal Aunt     Maternal uncle  . Diabetes      BP 90/60  Pulse 60  Temp(Src) 97.3 F (36.3 C)  Resp 12  Wt 149 lb (67.586 kg)  BMI 28.17 kg/m2   Alert and oriented x 3.  Memory function appears to be intact.  Concentration and attention are normal for educational level and background.  Speech is fluent and without significant word finding difficulty.  Is aware of current events.  No carotid bruits detected.  Cranial nerve II through XII are within normal limits.  This includes normal optic discs and acuity, EOMI, PERLA, facial movement and sensation intact, hearing grossly intact, gag intact,Uvula raises symmetrically and tongue protrudes evenly. Motor strength is 5 over 5 throughout all limbs.  No atrophy or abnormal tone .she does have some mild tremor of the upper extremities. Reflexes are 2-3+ and symmetric in the upper and lower extremities Sensory exam is intact. Coordination is intact for fine movements and rapid alternating movements in all limbs Gait and station are essentially normal.   Impression: Chronic lumbar and cervical spine pain and spine degeneration issues on chronic pain medication regimen. Past history of sleep apnea undergoing reevaluation Gluten sensitivity Depression.  Plan; She will continue followup at the pain  clinic for the pain issues She will also try acupuncture as we have  recommended for her pain and depression. She will continue the gluten-free diet she is found to be very helpful. Return to neurology p.r.n.

## 2012-11-10 ENCOUNTER — Other Ambulatory Visit: Payer: Self-pay

## 2012-11-10 DIAGNOSIS — Z1231 Encounter for screening mammogram for malignant neoplasm of breast: Secondary | ICD-10-CM

## 2012-11-28 ENCOUNTER — Ambulatory Visit
Admission: RE | Admit: 2012-11-28 | Discharge: 2012-11-28 | Disposition: A | Discharge status: Home / Self Care | Payer: Medicare HMO | Source: Ambulatory Visit

## 2012-11-28 DIAGNOSIS — Z1231 Encounter for screening mammogram for malignant neoplasm of breast: Secondary | ICD-10-CM

## 2012-11-30 ENCOUNTER — Other Ambulatory Visit: Payer: Self-pay | Admitting: Endocrinology

## 2012-11-30 DIAGNOSIS — R928 Other abnormal and inconclusive findings on diagnostic imaging of breast: Secondary | ICD-10-CM

## 2012-12-06 ENCOUNTER — Other Ambulatory Visit: Payer: Self-pay | Admitting: Endocrinology

## 2012-12-06 DIAGNOSIS — R928 Other abnormal and inconclusive findings on diagnostic imaging of breast: Secondary | ICD-10-CM

## 2012-12-07 ENCOUNTER — Ambulatory Visit
Admission: RE | Admit: 2012-12-07 | Discharge: 2012-12-07 | Disposition: A | Discharge status: Home / Self Care | Payer: Medicare HMO | Source: Ambulatory Visit | Attending: Endocrinology | Admitting: Endocrinology

## 2012-12-07 DIAGNOSIS — R928 Other abnormal and inconclusive findings on diagnostic imaging of breast: Secondary | ICD-10-CM

## 2012-12-19 ENCOUNTER — Other Ambulatory Visit: Payer: Medicare HMO

## 2012-12-20 ENCOUNTER — Ambulatory Visit (INDEPENDENT_AMBULATORY_CARE_PROVIDER_SITE_OTHER): Payer: Medicare HMO | Admitting: General Surgery

## 2013-02-16 ENCOUNTER — Other Ambulatory Visit: Payer: Self-pay

## 2013-07-28 ENCOUNTER — Other Ambulatory Visit (HOSPITAL_COMMUNITY): Payer: Self-pay | Admitting: Endocrinology

## 2013-07-28 ENCOUNTER — Ambulatory Visit (HOSPITAL_COMMUNITY)
Admission: RE | Admit: 2013-07-28 | Discharge: 2013-07-28 | Disposition: A | Discharge status: Home / Self Care | Payer: Medicare HMO | Source: Ambulatory Visit | Attending: Vascular Surgery | Admitting: Vascular Surgery

## 2013-07-28 DIAGNOSIS — H9319 Tinnitus, unspecified ear: Secondary | ICD-10-CM

## 2013-09-29 ENCOUNTER — Other Ambulatory Visit: Payer: Self-pay | Admitting: Endocrinology

## 2013-09-29 DIAGNOSIS — S20229A Contusion of unspecified back wall of thorax, initial encounter: Secondary | ICD-10-CM

## 2013-09-29 DIAGNOSIS — W19XXXA Unspecified fall, initial encounter: Secondary | ICD-10-CM

## 2013-10-05 ENCOUNTER — Ambulatory Visit
Admission: RE | Admit: 2013-10-05 | Discharge: 2013-10-05 | Disposition: A | Discharge status: Home / Self Care | Payer: Medicare Other | Source: Ambulatory Visit | Attending: Endocrinology | Admitting: Endocrinology

## 2013-10-05 DIAGNOSIS — S20229A Contusion of unspecified back wall of thorax, initial encounter: Secondary | ICD-10-CM

## 2013-10-05 DIAGNOSIS — W19XXXA Unspecified fall, initial encounter: Secondary | ICD-10-CM

## 2013-12-20 ENCOUNTER — Telehealth: Payer: Self-pay | Admitting: Internal Medicine

## 2013-12-20 NOTE — Telephone Encounter (Signed)
Pt states that she has been having changes in her bowels for quite some time. States she can feel the stool in the rectal vault but when she gets to the bathroom it is to late. Pt states in April she had a procedure on her back and they were to burn some nerve roots. States they burned the wrong root. Pt requests to be seen. Pt scheduled to see Amy Esterwood PA 01/01/14@1 :30pm. Pt aware of appt.

## 2014-01-01 ENCOUNTER — Encounter: Payer: Self-pay | Admitting: Physician Assistant

## 2014-01-01 ENCOUNTER — Ambulatory Visit (INDEPENDENT_AMBULATORY_CARE_PROVIDER_SITE_OTHER): Payer: Medicare Other | Admitting: Physician Assistant

## 2014-01-01 ENCOUNTER — Telehealth: Payer: Self-pay | Admitting: Physician Assistant

## 2014-01-01 ENCOUNTER — Other Ambulatory Visit (INDEPENDENT_AMBULATORY_CARE_PROVIDER_SITE_OTHER): Payer: Medicare Other

## 2014-01-01 VITALS — BP 100/60 | HR 76 | Ht 61.0 in | Wt 138.8 lb

## 2014-01-01 DIAGNOSIS — K5730 Diverticulosis of large intestine without perforation or abscess without bleeding: Secondary | ICD-10-CM

## 2014-01-01 DIAGNOSIS — R197 Diarrhea, unspecified: Secondary | ICD-10-CM

## 2014-01-01 DIAGNOSIS — E785 Hyperlipidemia, unspecified: Secondary | ICD-10-CM

## 2014-01-01 DIAGNOSIS — Z86010 Personal history of colonic polyps: Secondary | ICD-10-CM

## 2014-01-01 LAB — SEDIMENTATION RATE: SED RATE: 14 mm/h (ref 0–22)

## 2014-01-01 LAB — HIGH SENSITIVITY CRP: CRP, High Sensitivity: 2.51 mg/L (ref 0.000–5.000)

## 2014-01-01 MED ORDER — RIFAXIMIN 550 MG PO TABS: 550.0000 mg | ORAL_TABLET | Freq: Two times a day (BID) | ORAL | Status: AC

## 2014-01-01 MED ORDER — DICYCLOMINE HCL 10 MG PO CAPS: ORAL_CAPSULE | ORAL | Status: DC

## 2014-01-01 NOTE — Telephone Encounter (Signed)
I faxed office notes for this patient to [facility], Fax # : [phone].

## 2014-01-01 NOTE — Progress Notes (Signed)
Subjective:    Patient ID: [name], female    DOB: 1960-05-29, 53 y.o.   MRN: [id number]  HPI  [name] is a pleasant 53 year old white female known to Dr. Henrene Pastor also followed by Dr. Forde Dandy. She has history of adult-onset diabetes mellitus, seizure disorder, asthma, and is status post Roux-en-Y gastric bypass about 10 years ago. She had undergone EGD in May of 2012 which was unremarkable. She last had colonoscopy in 2012 as while showing a normal terminal ileum mild diverticulosis in the left colon and a small descending colon polyp which was a tubular adenoma. She is due for followup at a five-year interval. Patient comes in today because of problems with her bowels. She says she really doesn't know a normal bowel movement is at this point. She has had a lot of problems with alternating constipation and diarrhea over the past few years says this is been worse over the past several months. She had been having periodic "blowout" of diarrhea and another days may have 4 loose stools in the next day he not go at all. She had been using Levsin as needed for episodes of diarrhea is usually has to take up to 3 doses before the diarrhea stops. However she cannot take Imodium because this is very constipating. She does feel that she is lactose intolerant and generally feels better off of gluten. She said she been eating very little gluten but went to the beach last week and a lot of carbs and immediately her bloating and diarrhea returned. She has a lot of gas which is malodorous and says her stools are very malodorous when her loose. She feels bloated most of the time. She also relates periodic fecal incontinence. She says she may get an urge for bowel movement and has actually had a bowel movement by the time she gets to the bathroom. She says this is very sporadic. She has had a cervical spinal cord injury from an accident and also has a neurogenic bladder.   Review of Systems  Constitutional: Negative.     HENT: Negative.   Eyes: Negative.   Gastrointestinal: Positive for diarrhea, constipation and abdominal distention.  Endocrine: Negative.   Genitourinary: Negative.   Musculoskeletal: Positive for back pain.  Skin: Negative.   Allergic/Immunologic: Negative.   Neurological: Negative.   Hematological: Negative.   Psychiatric/Behavioral: Negative.    Outpatient Prescriptions Prior to Visit  Medication Sig Dispense Refill  . albuterol (VENTOLIN HFA) 108 (90 BASE) MCG/ACT inhaler Inhale 2 puffs into the lungs every 6 (six) hours as needed.        . carbamazepine (TEGRETOL) 200 MG tablet Take 200 mg by mouth 3 (three) times daily.       . Choline Fenofibrate (FENOFIBRIC ACID) 135 MG CPDR Take 1 capsule by mouth daily.       Marland Kitchen desvenlafaxine (PRISTIQ) 100 MG 24 hr tablet Take 100 mg by mouth at bedtime.       . ergocalciferol (VITAMIN D2) 50000 UNITS capsule Take 50,000 Units by mouth 2 (two) times a week.        . estradiol (ESTRACE) 1 MG tablet Take 2 mg by mouth daily.       . fluticasone (FLONASE) 50 MCG/ACT nasal spray Place 2 sprays into both nostrils every morning.       Marland Kitchen levothyroxine (SYNTHROID, LEVOTHROID) 100 MCG tablet Take 100 mcg by mouth daily. Take 1 tablet by mouth for 6 days of the week, none on Sunday      .  nadolol (CORGARD) 40 MG tablet Take 40 mg by mouth every morning.       . nitroGLYCERIN (NITROSTAT) 0.4 MG SL tablet Place 0.4 mg under the tongue every 5 (five) minutes as needed.        . valACYclovir (VALTREX) 500 MG tablet Take 500 mg by mouth at bedtime.       Marland Kitchen b complex vitamins tablet Take 1 tablet by mouth daily.      . cimetidine (TAGAMET) 400 MG tablet Take 400 mg by mouth 2 (two) times daily.      . clotrimazole-betamethasone (LOTRISONE) cream Apply topically daily.        . cyclobenzaprine (FLEXERIL) 10 MG tablet Take 10 mg by mouth at bedtime as needed.        . DULoxetine (CYMBALTA) 60 MG capsule Take 60 mg by mouth at bedtime.       . ferrous sulfate  325 (65 FE) MG tablet Take 325 mg by mouth 2 (two) times daily.        . folic acid (FOLVITE) 034 MCG tablet Take 400 mcg by mouth 2 (two) times daily.      . methadone (DOLOPHINE) 10 MG tablet Take 20 mg by mouth every 8 (eight) hours. Take 2 tablets by mouth twice a day       . Oxycodone-Acetaminophen (ENDOCET PO) Take 2 tablets by mouth at bedtime.        . psyllium (METAMUCIL) 58.6 % powder Take 1 packet by mouth daily as needed.        . senna (SENOKOT) 8.6 MG tablet Take 1 tablet by mouth as needed.        . zolpidem (AMBIEN) 10 MG tablet Take 10 mg by mouth. Take every 2 days as needed         No facility-administered medications prior to visit.   Allergies  Allergen Reactions  . Statins Other (See Comments)    Major headaches  . Sulfa Antibiotics Other (See Comments)    Yeast and thrush infections with just one dose   Patient Active Problem List   Diagnosis Date Noted  . Diverticulosis of colon without hemorrhage 01/01/2014  . H/O adenomatous polyp of colon 01/01/2014  . Seizure disorder 08/15/2010  . Depression 08/15/2010  . Asthma 08/15/2010  . Spinal stenosis 08/15/2010  . Hyperlipidemia 08/15/2010  . Sleep apnea 08/15/2010  . Personal hx of gastric bypass 08/15/2010  . Diabetes mellitus 08/15/2010   History  Substance Use Topics  . Smoking status: Never Smoker   . Smokeless tobacco: Never Used  . Alcohol Use: Yes     Comment: glass of wine a month      family history includes Colon cancer in her maternal aunt and maternal uncle; Coronary artery disease in her sister; Diabetes in an other family member; Heart attack (age of onset: 47) in her father; Heart disease in her mother and another family member; Kidney disease in her mother; Other in her son. There is no history of Liver disease.  Objective:   Physical Exam well-developed white female in no acute distress, blood pressure 100/60 pulse 76 height 5 foot 1 weight 138. HEENT; nontraumatic normocephalic EOMI  PERRLA sclera anicteric, Supple ;no JVD, Cardiovascular ;regular rate and rhythm with S1-S2 no murmur or gallop, Pulmonary; clear bilaterally;, Abdomen soft nondistended bowel sounds are active there is no palpable mass or hepatosplenomegaly bowel sounds are present, Rectal; exam not done, Extremities; no clubbing cyanosis or edema skin warm and dry,  Psych; mood and affect appropriate        Assessment & Plan:  #64  53 year old female status post Roux-en-Y gastric bypass approximately 10 years ago with complaints of abdominal bloating gas and diarrhea alternating with irregular bowel movements and occasional constipation. More frequent problems with gas and diarrhea recently Rule out small bowel bacterial overgrowth Suspect underlying IBS D.  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 Henrene Pastor or myself in 4-6 weeks

## 2014-01-01 NOTE — Patient Instructions (Addendum)
Please go to the basement level to have your labs drawn.  Stop the Levsin. We sent a prescription for Bentyl ( Dicyclomine)  To [facility].  We also sent a prescription for the Xifaxan 550 mg to [facility] electronically. They will call your insurance company and then send the medication to your home.  Take Restora or Florastor. You can get these at the pharmacy.They are a probiltic.

## 2014-01-01 NOTE — Progress Notes (Signed)
Agree with initial assessment and plans 

## 2014-01-02 LAB — CELIAC PANEL 10
Endomysial Screen: NEGATIVE
GLIADIN IGG: 11.1 U/mL (ref <20)
Gliadin IgA: 6.6 U/mL (ref <20)
IgA: 160 mg/dL (ref 69–380)
Tissue Transglut Ab: 5.9 U/mL (ref <20)
Tissue Transglutaminase Ab, IgA: 4.6 U/mL (ref <20)

## 2014-01-03 ENCOUNTER — Telehealth: Payer: Self-pay | Admitting: *Deleted

## 2014-01-03 NOTE — Telephone Encounter (Signed)
I got a call from [place] at [facility] to inform me that the patient will have no cost and the medication will be delivered to her Thursday 01-04-2014.  I then called the patient and informed her she has no charge for the medication and it will be delivered to her home tomorrow 01-04-2014.

## 2014-02-16 ENCOUNTER — Ambulatory Visit (INDEPENDENT_AMBULATORY_CARE_PROVIDER_SITE_OTHER): Payer: Medicare Other | Admitting: Internal Medicine

## 2014-02-16 ENCOUNTER — Encounter: Payer: Self-pay | Admitting: Internal Medicine

## 2014-02-16 VITALS — BP 112/66 | HR 68 | Ht 61.0 in | Wt 140.4 lb

## 2014-02-16 DIAGNOSIS — K5730 Diverticulosis of large intestine without perforation or abscess without bleeding: Secondary | ICD-10-CM

## 2014-02-16 DIAGNOSIS — Z86010 Personal history of colonic polyps: Secondary | ICD-10-CM

## 2014-02-16 DIAGNOSIS — R197 Diarrhea, unspecified: Secondary | ICD-10-CM

## 2014-02-16 DIAGNOSIS — K6389 Other specified diseases of intestine: Secondary | ICD-10-CM

## 2014-02-16 MED ORDER — RIFAXIMIN 550 MG PO TABS: 550.0000 mg | ORAL_TABLET | Freq: Two times a day (BID) | ORAL | Status: DC

## 2014-02-16 MED ORDER — DICYCLOMINE HCL 10 MG PO CAPS: ORAL_CAPSULE | ORAL | Status: DC

## 2014-02-16 NOTE — Patient Instructions (Signed)
We have sent the following medications to your pharmacy for you to pick up at your convenience:  Bentyl, Xifaxan

## 2014-02-16 NOTE — Progress Notes (Signed)
HISTORY OF PRESENT ILLNESS:  [name] is a 53 y.o. female with diabetes mellitus, seizure disorder, asthma, and prior Roux-en-Y gastric bypass surgery approximately 10 years ago. She has had prior upper endoscopy in May 2012 (unremarkable postop) and colonoscopy in 2012 (mild diverticulosis and small tubular adenoma). She was seen by the physician assistant 01/01/2014 regarding bloating, loose stools with foul smelling gas. Testing for celiac disease was negative. She was given Bentyl for abdominal discomfort. Xifaxan 550 mg twice daily for 10 days was prescribed. She presents today for follow-up. Patient reports that after completing Xifaxan therapy her symptoms essentially resolved. However, she has noticed recurrence over the past week. This is not as severe. GI review of systems otherwise negative.  REVIEW OF SYSTEMS:  All non-GI ROS negative except for back pain  Past Medical History  Diagnosis Date  . Seizure disorder   . Diabetes mellitus   . Hyperlipidemia   . Depression   . Chronic back pain     spinal stenosis  . Hypothyroidism   . Hypertension   . Anemia   . Anxiety   . Asthma   . Neurogenic bladder   . Myoclonic epilepsy   . Tremor   . Fatty liver   . Hx of colonic polyps     adenomatous  . Diverticulosis     Past Surgical History  Procedure Laterality Date  . Gastric bypass  2005  . Neck fusion  1998  . Abdominal hysterectomy    . Tubal ligation    . Peniculectomy  2010  . Carpal tunnel left    . Nerve surgery      Social History [name]  reports that she has never smoked. She has never used smokeless tobacco. She reports that she drinks alcohol. She reports that she does not use illicit drugs.  family history includes Colon cancer in her maternal aunt and maternal uncle; Coronary artery disease in her sister; Diabetes in an other family member; Heart attack (age of onset: 101) in her father; Heart disease in her mother and another family member;  Kidney disease in her mother; Other in her son. There is no history of Liver disease.  Allergies  Allergen Reactions  . Statins Other (See Comments)    Major headaches  . Sulfa Antibiotics Other (See Comments)    Yeast and thrush infections with just one dose       PHYSICAL EXAMINATION: Vital signs: BP 112/66 mmHg  Pulse 68  Ht 5\' 1"  (1.549 m)  Wt 140 lb 6.4 oz (63.685 kg)  BMI 26.54 kg/m2 General: Well-developed, well-nourished, no acute distress HEENT: Sclerae are anicteric, conjunctiva pink. Oral mucosa intact Lungs: Clear Heart: Regular Abdomen: soft, nontender, nondistended, no obvious ascites, no peritoneal signs, normal bowel sounds. No organomegaly. Extremities: No edema Psychiatric: alert and oriented x3. Cooperative     ASSESSMENT:  #1. Suspect that problems with change in bowel habits, gas/bloating were related to bacterial overgrowth secondary to her gastric bypass surgery. Good response with initial course of Xifaxan. Seems of had some recurrence of symptoms. He was taking probiotic as well. Intermittent problems with abdominal spasm treated with antispasmodics. #2. History of adenomatous colon polyp May 2012   PLAN:  #1. Prescribe additional course of Xifaxan 550 mg twice a day 2 weeks. Several refills given. She may use this on demand for recurrent symptoms as needed.  #2. Refill Bentyl as requested for intermittent abdominal spasm. #3. Surveillance colonoscopy in May 2017. Interval GI follow-up  as needed

## 2014-03-14 ENCOUNTER — Telehealth: Payer: Self-pay | Admitting: Internal Medicine

## 2014-03-14 NOTE — Telephone Encounter (Signed)
Pt states she has finished the xifaxan that was given to her 02/16/14. States she has been off of it for a couple of days and the gas has returned and had a strong odor again. She does have refills on the xifaxan but has not started taking it again. States that yesterday she started having liquid diarrhea and took bentyl and hyomax to slow it down some but at 2am she woke up and was covered in diarrhea. Pt states her PCP started her on Zetia and she stopped that yesterday because she thinks that is not helping. Pt wants to know what else she can do. Please advise.

## 2014-03-15 NOTE — Telephone Encounter (Signed)
Another course of Xifaxan and Imodium for diarrhea

## 2014-03-15 NOTE — Telephone Encounter (Signed)
Spoke with pt and she is aware.

## 2014-03-22 ENCOUNTER — Telehealth: Payer: Self-pay | Admitting: Internal Medicine

## 2014-03-22 NOTE — Telephone Encounter (Signed)
Pt states that the discomfort she has is in her stomach and that when she pushes on it there are sharp pains. Pt scheduled to see Cecille Rubin Hvozdovic, PA-C tomorrow at 1:45pm. Pt aware of appt.

## 2014-03-22 NOTE — Telephone Encounter (Signed)
I'm not unsure which she is alluding to... If she is having bloating, this may not improve. If she is having true pain, a reevaluation with an extender is in order. Question the need for further diagnostic testing such as imaging.

## 2014-03-22 NOTE — Telephone Encounter (Signed)
Pt states she is taking her third round of xifaxan and she has noticed that her pouch feels very tender, almost like she has been punched in the stomach. Pt wants to know if Dr. Henrene Pastor thinks this will stop and get better once she finishes the xifaxan. Please advise.

## 2014-03-23 ENCOUNTER — Other Ambulatory Visit (INDEPENDENT_AMBULATORY_CARE_PROVIDER_SITE_OTHER): Payer: Medicare Other

## 2014-03-23 ENCOUNTER — Encounter: Payer: Self-pay | Admitting: Physician Assistant

## 2014-03-23 ENCOUNTER — Telehealth: Payer: Self-pay | Admitting: Physician Assistant

## 2014-03-23 ENCOUNTER — Ambulatory Visit (INDEPENDENT_AMBULATORY_CARE_PROVIDER_SITE_OTHER): Payer: Medicare Other | Admitting: Physician Assistant

## 2014-03-23 VITALS — BP 100/64 | HR 88 | Ht 61.0 in | Wt 134.0 lb

## 2014-03-23 DIAGNOSIS — R140 Abdominal distension (gaseous): Secondary | ICD-10-CM

## 2014-03-23 DIAGNOSIS — Z9884 Bariatric surgery status: Secondary | ICD-10-CM

## 2014-03-23 DIAGNOSIS — R197 Diarrhea, unspecified: Secondary | ICD-10-CM

## 2014-03-23 DIAGNOSIS — K810 Acute cholecystitis: Secondary | ICD-10-CM

## 2014-03-23 DIAGNOSIS — R1013 Epigastric pain: Secondary | ICD-10-CM

## 2014-03-23 DIAGNOSIS — R1011 Right upper quadrant pain: Secondary | ICD-10-CM

## 2014-03-23 LAB — COMPREHENSIVE METABOLIC PANEL
ALT: 35 U/L (ref 0–35)
AST: 34 U/L (ref 0–37)
Albumin: 4.3 g/dL (ref 3.5–5.2)
Alkaline Phosphatase: 42 U/L (ref 39–117)
BILIRUBIN TOTAL: 0.4 mg/dL (ref 0.2–1.2)
BUN: 18 mg/dL (ref 6–23)
CO2: 26 meq/L (ref 19–32)
CREATININE: 0.7 mg/dL (ref 0.4–1.2)
Calcium: 9.3 mg/dL (ref 8.4–10.5)
Chloride: 104 mEq/L (ref 96–112)
GFR: 87.94 mL/min (ref >60.00)
Glucose, Bld: 90 mg/dL (ref 70–99)
Potassium: 4.8 mEq/L (ref 3.5–5.1)
Sodium: 135 mEq/L (ref 135–145)
TOTAL PROTEIN: 6.9 g/dL (ref 6.0–8.3)

## 2014-03-23 LAB — VITAMIN D 25 HYDROXY (VIT D DEFICIENCY, FRACTURES): VITD: 100.9 ng/mL — ABNORMAL HIGH (ref 30.00–100.00)

## 2014-03-23 MED ORDER — PANCRELIPASE (LIP-PROT-AMYL) 12000-38000 UNITS PO CPEP: ORAL_CAPSULE | ORAL | Status: DC

## 2014-03-23 MED ORDER — PANTOPRAZOLE SODIUM 40 MG PO TBEC: 40.0000 mg | DELAYED_RELEASE_TABLET | Freq: Every day | ORAL | Status: DC

## 2014-03-23 NOTE — Addendum Note (Signed)
Addended by: Hope Pigeon A on: 03/23/2014 03:14 PM   Modules accepted: Orders

## 2014-03-23 NOTE — Patient Instructions (Signed)
You have been scheduled for an abdominal ultrasound at [facility] Radiology (1st floor of hospital) on 03-29-2014 at 9:30 am. Please arrive 15 minutes prior to your appointment for registration. Make certain not to have anything to eat or drink 6 hours prior to your appointment. Should you need to reschedule your appointment, please contact radiology at [phone]. This test typically takes about 30 minutes to perform.  You have been scheduled for a Small Bowel Follow Through at [facility]iology ( 1st floor of hospital) on 03-29-2014 at 10:30 am. Should you need to reschedule your appointment, please contact radiology at [phone]. _______________________________________________________________________________________________________________________________________________________________________________________________  Please go to the basement for lab work today before leaving.  We have sent the following medications to your pharmacy for you to pick up at your convenience: Pantoprazole 40 mg, take one tablet by mouth once daily 30 minutes before breakfast Creon 12000 units, take one tablet with meals and snacks   You have a follow up visit scheduled with Dr. Henrene Pastor for 05-07-2014 at  3:45 pm. If you are not feeling better by then or need to reschedule please call [phone].

## 2014-03-23 NOTE — Progress Notes (Signed)
Patient ID: [name], female   DOB: Nov 16, 1960, 53 y.o.   MRN: [id number]     History of Present Illness:  [name] is 53 year old female who presents today with ongoing gas, bloating, loose stools. She has a history of diabetes mellitus, seizure disorder, asthma, and prior Roux-en-Y gastric bypass surgery approximately 10 years ago. She had an EGD in May 2012 that was unremarkable and a colonoscopy in 2012 that showed mild diverticulosis and a small tubular adenoma. She was seen in September regarding her loose stools bloating and foul smelling gas and testing for celiac disease was negative she was given Bentyl for abdominal discomfort which has provided marginal relief she was given a course of Xifaxan with some resolution of her symptoms however shortly after coming off the Xifaxan her symptoms quickly recurred she is here today for largely seen and she has had 3 courses of Xifaxan this fall and is currently on Xifaxan. Each time she comes off the Xifaxan she is has a recurrence of her symptoms with foul smelling sticky stools that float and bloating. She is also having upper abdominal bloating, distention, and pyrosis. She has intermittent epigastric and right upper quadrant pain with waves of nausea but no vomiting.  Past Medical History  Diagnosis Date  . Seizure disorder   . Diabetes mellitus   . Hyperlipidemia   . Depression   . Chronic back pain     spinal stenosis  . Hypothyroidism   . Hypertension   . Anemia   . Anxiety   . Asthma   . Neurogenic bladder   . Myoclonic epilepsy   . Tremor   . Fatty liver   . Hx of colonic polyps     adenomatous  . Diverticulosis     Past Surgical History  Procedure Laterality Date  . Gastric bypass  2005  . Neck fusion  1998  . Abdominal hysterectomy    . Tubal ligation    . Peniculectomy  2010  . Carpal tunnel left    . Nerve surgery     Family History  Problem Relation Age of Onset  . Liver disease Neg Hx   . Heart disease  Mother   . Kidney disease Mother   . Heart attack Father 32  . Coronary artery disease Sister     x 2  . Colon cancer Maternal Uncle   . Colon cancer Maternal Aunt   . Diabetes    . Heart disease      paternal side  . Other Son     fatty liver   History  Substance Use Topics  . Smoking status: Never Smoker   . Smokeless tobacco: Never Used  . Alcohol Use: Yes     Comment: glass of wine a month   Current Outpatient Prescriptions  Medication Sig Dispense Refill  . albuterol (VENTOLIN HFA) 108 (90 BASE) MCG/ACT inhaler Inhale 2 puffs into the lungs every 6 (six) hours as needed.      . BUSPIRONE HCL PO Take 1 tablet by mouth 2 (two) times daily as needed (anxiety).    . carbamazepine (TEGRETOL) 200 MG tablet Take 200 mg by mouth 3 (three) times daily.     . Choline Fenofibrate (FENOFIBRIC ACID) 135 MG CPDR Take 1 capsule by mouth daily.     . Cyanocobalamin 1000 MCG SUBL Place under the tongue daily.    Marland Kitchen desvenlafaxine (PRISTIQ) 100 MG 24 hr tablet Take 100 mg by mouth at bedtime.     Marland Kitchen  dicyclomine (BENTYL) 10 MG capsule Take 1 tab in the morning. 30 capsule 11  . DULoxetine (CYMBALTA) 60 MG capsule Take 60 mg by mouth daily.    . ergocalciferol (VITAMIN D2) 50000 UNITS capsule Take 50,000 Units by mouth 2 (two) times a week.      . estradiol (ESTRACE) 1 MG tablet Take 2 mg by mouth daily.     . Eszopiclone 3 MG TABS Take 3 mg by mouth at bedtime as needed. Take immediately before bedtime    . fentaNYL (DURAGESIC - DOSED MCG/HR) 50 MCG/HR Place 75 mcg onto the skin every 3 (three) days.     . fluticasone (FLONASE) 50 MCG/ACT nasal spray Place 2 sprays into both nostrils every morning.     Marland Kitchen FOLIC ACID PO Take by mouth daily. Sublingual    . levothyroxine (SYNTHROID, LEVOTHROID) 100 MCG tablet Take 100 mcg by mouth daily. Take 1 tablet by mouth for 6 days of the week, none on Sunday    . loratadine (CLARITIN) 10 MG tablet Take 10 mg by mouth every morning.    . nadolol (CORGARD) 40  MG tablet Take 40 mg by mouth every morning.     . nitroGLYCERIN (NITROSTAT) 0.4 MG SL tablet Place 0.4 mg under the tongue every 5 (five) minutes as needed.      Marland Kitchen oxyCODONE-acetaminophen (PERCOCET/ROXICET) 5-325 MG per tablet Take 1 tablet by mouth 4 (four) times daily as needed for severe pain.    . rifaximin (XIFAXAN) 550 MG TABS tablet Take 1 tablet (550 mg total) by mouth 2 (two) times daily. 28 tablet 3  . tiZANidine (ZANAFLEX) 4 MG tablet Take 1/2-1 tablet by mouth four times daily as needed    . valACYclovir (VALTREX) 500 MG tablet Take 500 mg by mouth at bedtime.      No current facility-administered medications for this visit.   Allergies  Allergen Reactions  . Statins Other (See Comments)    Major headaches  . Sulfa Antibiotics Other (See Comments)    Yeast and thrush infections with just one dose      Review of Systems: Gen: Denies any fever, chills, sweats, anorexia, fatigue, weakness, malaise, weight loss, and sleep disorder CV: Denies chest pain, angina, palpitations, syncope, orthopnea, PND, peripheral edema, and claudication. Resp: Denies dyspnea at rest, dyspnea with exercise, cough, sputum, wheezing, coughing up blood, and pleurisy. GI: Denies vomiting blood, jaundice, and fecal incontinence.   Denies dysphagia or odynophagia. GU : Denies urinary burning, blood in urine, urinary frequency, urinary hesitancy, nocturnal urination, and urinary incontinence. MS: Denies joint pain, limitation of movement, and swelling, stiffness, low back pain, extremity pain. Denies muscle weakness, cramps, atrophy.  Derm: Denies rash, itching, dry skin, hives, moles, warts, or unhealing ulcers.  Psych: Denies depression, anxiety, memory loss, suicidal ideation, hallucinations, paranoia, and confusion. Heme: Denies bruising, bleeding, and enlarged lymph nodes. Neuro:  Denies any headaches, dizziness, paresthesia Endo:  Denies any problems with DM, thyroid, adrenal    Physical  Exam: General: Pleasant, well developed , female in no acute distress Head: Normocephalic and atraumatic Eyes:  sclerae anicteric, conjunctiva pink  Ears: Normal auditory acuity Lungs: Clear throughout to auscultation Heart: Regular rate and rhythm Abdomen: Soft, non distended, non-tender. No masses, no hepatomegaly. Normal bowel sounds Musculoskeletal: Symmetrical with no gross deformities  Extremities: No edema  Neurological: Alert oriented x 4, grossly nonfocal Psychological:  Alert and cooperative. Normal mood and affect  Assessment and Recommendations: 53 year old female with loose stools, gas and  bloating, here for follow-up her symptoms may be in part due to bacterial overgrowth but she has rapid recurrence when she comes off the antibiotics. We will obtain an upper GI with a small bowel follow-through to look for a possible blind loop, small bowel diverticulum, or focal dilation. A stool for pancreatic fecal elastase in the stool for fecal fat will be obtained as well vitamin A. D, K. she will be given a trial of omeprazole 40 mg by mouth every morning 30 minutes prior to breakfast and an abdominal ultrasound will also be obtained to evaluate for cholelithiasis, ductal dilation etc. She will follow up in 6 weeks sooner as needed.    Edina Winningham, Vita Barley PA-C 03/23/2014,

## 2014-03-23 NOTE — Telephone Encounter (Signed)
RX sent

## 2014-03-23 NOTE — Progress Notes (Signed)
Agree 

## 2014-03-26 ENCOUNTER — Other Ambulatory Visit: Payer: Medicare Other

## 2014-03-26 ENCOUNTER — Other Ambulatory Visit: Payer: Self-pay | Admitting: Physician Assistant

## 2014-03-26 DIAGNOSIS — R1013 Epigastric pain: Secondary | ICD-10-CM

## 2014-03-26 DIAGNOSIS — R197 Diarrhea, unspecified: Secondary | ICD-10-CM

## 2014-03-26 DIAGNOSIS — R140 Abdominal distension (gaseous): Secondary | ICD-10-CM

## 2014-03-27 LAB — VITAMIN A: Vitamin A (Retinoic Acid): 77 ug/dL (ref 38–98)

## 2014-03-27 LAB — VITAMIN C: Vitamin C: 0.2 mg/dL (ref 0.2–1.5)

## 2014-03-28 ENCOUNTER — Other Ambulatory Visit: Payer: Self-pay | Admitting: *Deleted

## 2014-03-29 ENCOUNTER — Ambulatory Visit (HOSPITAL_COMMUNITY)
Admission: RE | Admit: 2014-03-29 | Discharge: 2014-03-29 | Disposition: A | Discharge status: Home / Self Care | Payer: Medicare Other | Source: Ambulatory Visit | Attending: Physician Assistant | Admitting: Physician Assistant

## 2014-03-29 DIAGNOSIS — R1011 Right upper quadrant pain: Secondary | ICD-10-CM | POA: Diagnosis not present

## 2014-03-29 DIAGNOSIS — K449 Diaphragmatic hernia without obstruction or gangrene: Secondary | ICD-10-CM | POA: Insufficient documentation

## 2014-03-29 DIAGNOSIS — R112 Nausea with vomiting, unspecified: Secondary | ICD-10-CM | POA: Insufficient documentation

## 2014-03-29 DIAGNOSIS — Z980 Intestinal bypass and anastomosis status: Secondary | ICD-10-CM | POA: Diagnosis not present

## 2014-03-29 DIAGNOSIS — Z931 Gastrostomy status: Secondary | ICD-10-CM | POA: Insufficient documentation

## 2014-03-29 DIAGNOSIS — R140 Abdominal distension (gaseous): Secondary | ICD-10-CM | POA: Insufficient documentation

## 2014-03-29 DIAGNOSIS — Q631 Lobulated, fused and horseshoe kidney: Secondary | ICD-10-CM | POA: Diagnosis not present

## 2014-03-29 DIAGNOSIS — Z9884 Bariatric surgery status: Secondary | ICD-10-CM | POA: Diagnosis not present

## 2014-03-29 DIAGNOSIS — K810 Acute cholecystitis: Secondary | ICD-10-CM

## 2014-03-29 LAB — FECAL FAT, QUALITATIVE: FECAL FAT QUALITATIVE: NORMAL

## 2014-03-30 LAB — VITAMIN K1, SERUM: VITAMIN K: 117 pg/mL (ref 80–1160)

## 2014-04-04 LAB — PANCREATIC ELASTASE, FECAL: Pancreatic Elastase-1, Stool: 393 mcg/g

## 2014-04-30 ENCOUNTER — Other Ambulatory Visit: Payer: Self-pay

## 2014-04-30 ENCOUNTER — Telehealth: Payer: Self-pay | Admitting: *Deleted

## 2014-04-30 DIAGNOSIS — R197 Diarrhea, unspecified: Secondary | ICD-10-CM

## 2014-04-30 MED ORDER — PANTOPRAZOLE SODIUM 40 MG PO TBEC: 40.0000 mg | DELAYED_RELEASE_TABLET | Freq: Every day | ORAL | Status: DC

## 2014-04-30 NOTE — Telephone Encounter (Signed)
We had sent a prescription for Protonix 40 mg to the patients pharmacy, [facility] [address], [place].  We received a request from [facility] to send it there .  I did that today, 04-30-2014.  Pantoprazole Sodium 40 mg.  # 90 with 3 refills.

## 2014-05-03 ENCOUNTER — Other Ambulatory Visit: Payer: Self-pay

## 2014-05-03 DIAGNOSIS — R197 Diarrhea, unspecified: Secondary | ICD-10-CM

## 2014-05-07 ENCOUNTER — Ambulatory Visit (INDEPENDENT_AMBULATORY_CARE_PROVIDER_SITE_OTHER): Payer: Medicare Other | Admitting: Internal Medicine

## 2014-05-07 ENCOUNTER — Encounter: Payer: Self-pay | Admitting: Internal Medicine

## 2014-05-07 VITALS — BP 130/70 | HR 59 | Ht 61.0 in | Wt 135.0 lb

## 2014-05-07 DIAGNOSIS — R197 Diarrhea, unspecified: Secondary | ICD-10-CM

## 2014-05-07 DIAGNOSIS — R140 Abdominal distension (gaseous): Secondary | ICD-10-CM

## 2014-05-07 DIAGNOSIS — Z9884 Bariatric surgery status: Secondary | ICD-10-CM

## 2014-05-07 DIAGNOSIS — Z86010 Personal history of colonic polyps: Secondary | ICD-10-CM

## 2014-05-07 MED ORDER — RIFAXIMIN 550 MG PO TABS: 550.0000 mg | ORAL_TABLET | Freq: Two times a day (BID) | ORAL | Status: DC

## 2014-05-07 NOTE — Progress Notes (Signed)
HISTORY OF PRESENT ILLNESS:  [name] is a 54 y.o. female with diabetes mellitus, seizure disorder, asthma, prior Roux-en-Y gastric bypass surgery approximately 10 years ago. Prior upper endoscopy May 2012 was unremarkable postoperative exam. Colonoscopy 2012 revealed mild diverticulosis and small tubular adenoma. She has been seen in the past several months with complaints of bloating, loose stools, and foul-smelling gas. Testing for celiac disease has been negative. Periodic courses of Xifaxan have been helpful. Blood work including comprehensive metabolic panel, C-reactive protein, sedimentation rate, vitamin K, vitamin D, vitamin C levels have been normal. Qualitative fecal fat normal. Pancreatic elastase normal. She did undergo upper GI small bowel follow-through 03/29/2014. No evidence for blind loop syndrome or other issues postop. Abdominal ultrasound for right upper quadrant pain was negative except for incidental horseshoe kidney. She presents today for follow-up. She continues to report alternating bowel habits. She does use chronic opiates for back pain. Occasional nausea with vomiting. No GI bleeding. Also complains of foul-smelling flatus. Recently treated with Augmentin for dog bite.  REVIEW OF SYSTEMS:  All non-GI ROS negative except for arthritis, back pain, fatigue, itching, muscle cramps, sleeping problems  Past Medical History  Diagnosis Date  . Seizure disorder   . Diabetes mellitus   . Hyperlipidemia   . Depression   . Chronic back pain     spinal stenosis  . Hypothyroidism   . Hypertension   . Anemia   . Anxiety   . Asthma   . Neurogenic bladder   . Myoclonic epilepsy   . Tremor   . Fatty liver   . Hx of colonic polyps     adenomatous  . Diverticulosis     Past Surgical History  Procedure Laterality Date  . Gastric bypass  2005  . Neck fusion  1998  . Abdominal hysterectomy    . Tubal ligation    . Peniculectomy  2010  . Carpal tunnel left    .  Nerve surgery      Social History FRAYDA EGLEY  reports that she has never smoked. She has never used smokeless tobacco. She reports that she drinks alcohol. She reports that she does not use illicit drugs.  family history includes Colon cancer in her maternal aunt and maternal uncle; Coronary artery disease in her sister; Diabetes in an other family member; Heart attack (age of onset: 53) in her father; Heart disease in her mother and another family member; Kidney disease in her mother; Other in her son. There is no history of Liver disease.  Allergies  Allergen Reactions  . Statins Other (See Comments)    Major headaches  . Sulfa Antibiotics Other (See Comments)    Yeast and thrush infections with just one dose       PHYSICAL EXAMINATION: Vital signs: BP 130/70 mmHg  Pulse 59  Ht 5\' 1"  (1.549 m)  Wt 135 lb (61.236 kg)  BMI 25.52 kg/m2  SpO2 98% General: Well-developed, well-nourished, no acute distress HEENT: Sclerae are anicteric, conjunctiva pink. Oral mucosa intact Lungs: Clear Heart: Regular Abdomen: soft, nontender, nondistended, no obvious ascites, no peritoneal signs, normal bowel sounds. No organomegaly. Prior surgical incision well-healed Extremities: No edema Psychiatric: alert and oriented x3. Cooperative     ASSESSMENT:  #1. Chronic alternating bowel habits most consistent with IBS #2. Bloating increased intestinal gas. Probable bacterial overgrowth given seemingly good but short-lived response to Xifaxan #3. 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 History of adenomatous colon polyps on colonoscopy  2012   PLAN:  #1. Prescribe Xifaxan 550 mg twice a day 2 weeks. I have given her 6 refills. I asked cannot take this more than one course every 2 months. But, if this helps significantly, periodic pulse treatment is reasonable #2. Surveillance colonoscopy around 2017. Interval follow-up as needed

## 2014-05-07 NOTE — Patient Instructions (Signed)
We have sent the following medications to your pharmacy for you to pick up at your convenience: Xifaxan  

## 2014-05-15 ENCOUNTER — Other Ambulatory Visit: Payer: Self-pay

## 2014-05-15 DIAGNOSIS — R197 Diarrhea, unspecified: Secondary | ICD-10-CM

## 2014-05-17 ENCOUNTER — Other Ambulatory Visit: Payer: Self-pay

## 2014-05-17 DIAGNOSIS — R197 Diarrhea, unspecified: Secondary | ICD-10-CM

## 2014-06-20 ENCOUNTER — Encounter: Payer: Self-pay | Admitting: Neurology

## 2014-06-20 ENCOUNTER — Ambulatory Visit (INDEPENDENT_AMBULATORY_CARE_PROVIDER_SITE_OTHER): Payer: Medicare Other | Admitting: Neurology

## 2014-06-20 VITALS — BP 114/64 | HR 72 | Ht 61.0 in | Wt 131.0 lb

## 2014-06-20 DIAGNOSIS — Z5181 Encounter for therapeutic drug level monitoring: Secondary | ICD-10-CM

## 2014-06-20 DIAGNOSIS — G250 Essential tremor: Secondary | ICD-10-CM

## 2014-06-20 DIAGNOSIS — G40909 Epilepsy, unspecified, not intractable, without status epilepticus: Secondary | ICD-10-CM

## 2014-06-20 HISTORY — DX: Essential tremor: G25.0
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
## 2014-06-20 NOTE — Patient Instructions (Signed)

## 2014-06-21 ENCOUNTER — Telehealth: Payer: Self-pay | Admitting: Neurology

## 2014-06-21 NOTE — Telephone Encounter (Signed)
Patient is calling as she is in too much pain to come in for blood work.  She lives in [place] so could her orders for bloodwork be transferred to [facility] to a [facility] lab or to hospital. Please call.

## 2014-06-21 NOTE — Telephone Encounter (Signed)
Spoke to patient and relayed that there is a Psychologist, forensic at [place] in [place], and I have faxed lab orders over and received a confirmation.

## 2014-06-22 ENCOUNTER — Other Ambulatory Visit: Payer: Self-pay | Admitting: Neurology

## 2014-06-26 LAB — PRIMIDONE, SERUM
Phenobarbital Lvl: 5 ug/mL — ABNORMAL LOW (ref 15–40)
Primidone Lvl: 1.8 ug/mL — ABNORMAL LOW (ref 5.0–12.0)

## 2014-06-26 LAB — CARBAMAZEPINE LEVEL, TOTAL: Carbamazepine Lvl: 4.2 ug/mL (ref 4.0–12.0)

## 2014-06-29 ENCOUNTER — Ambulatory Visit (INDEPENDENT_AMBULATORY_CARE_PROVIDER_SITE_OTHER): Payer: Medicare Other | Admitting: Neurology

## 2014-06-29 ENCOUNTER — Encounter: Payer: Self-pay | Admitting: Neurology

## 2014-06-29 VITALS — BP 110/70 | HR 84 | Ht 61.0 in | Wt 130.0 lb

## 2014-06-29 DIAGNOSIS — F330 Major depressive disorder, recurrent, mild: Secondary | ICD-10-CM

## 2014-06-29 DIAGNOSIS — G250 Essential tremor: Secondary | ICD-10-CM
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
## 2014-07-03 ENCOUNTER — Encounter: Payer: Self-pay | Admitting: Neurology

## 2014-07-03 ENCOUNTER — Telehealth: Payer: Self-pay | Admitting: Neurology

## 2014-07-03 MED ORDER — PRIMIDONE 50 MG PO TABS: 100.0000 mg | ORAL_TABLET | Freq: Every evening | ORAL | Status: DC

## 2014-07-03 NOTE — Telephone Encounter (Signed)
I called the patient. The patient is still having ongoing tremors, not a candidate for deep brain stimulation, we will go up on the primidone taking 100 mg at night. I called in another prescription for her.
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
## 2014-07-04 ENCOUNTER — Other Ambulatory Visit: Payer: Self-pay | Admitting: Neurology

## 2014-07-04 ENCOUNTER — Telehealth: Payer: Self-pay | Admitting: Neurology

## 2014-07-04 MED ORDER — PRIMIDONE 50 MG PO TABS: 150.0000 mg | ORAL_TABLET | Freq: Every evening | ORAL | Status: DC

## 2014-07-04 NOTE — Telephone Encounter (Signed)
Autumn with [facility] @ [phone], questioning directions/dosage for Rx primidone (MYSOLINE) 50 MG tablet.  Please call and advise.

## 2014-07-04 NOTE — Telephone Encounter (Signed)
I called back.  Spoke with Autumn.  Verified info.

## 2014-07-09 ENCOUNTER — Encounter: Payer: Self-pay | Admitting: Neurology

## 2014-07-19 ENCOUNTER — Telehealth: Payer: Self-pay | Admitting: Neurology

## 2014-07-19 MED ORDER — PRIMIDONE 50 MG PO TABS
ORAL_TABLET | ORAL | Status: DC
Start: 2014-07-19 — End: 2014-08-06

## 2014-07-19 NOTE — Telephone Encounter (Signed)
Pt is calling stating that Dr Forde Dandy had stopped her Beta blocker, and she wants to know if you want to up her dose for primidone (MYSOLINE) 50 MG tablet because her tremors have gotten worse, or do you have another suggestion.  Please call and advise.

## 2014-07-19 NOTE — Telephone Encounter (Signed)
I called the patient. The patient was taken off of Nadolol as her blood pressure was dropping down to low. The tremor worsened off of this medication. She is on 150 mg of Mysoline at night. We can go to 50 mg in the morning, and 150 mg at night.

## 2014-08-06 ENCOUNTER — Telehealth: Payer: Self-pay | Admitting: Neurology

## 2014-08-06 MED ORDER — PRIMIDONE 250 MG PO TABS: 125.0000 mg | ORAL_TABLET | Freq: Two times a day (BID) | ORAL | Status: DC

## 2014-08-06 NOTE — Telephone Encounter (Signed)
Patient called stating that the dosage for the primidone (MYSOLINE) 50 MG tablet is not working for her. She doesn't feel like it is helping at all. She would like to know if the dosage can be increased. C/b [phone]

## 2014-08-06 NOTE — Telephone Encounter (Signed)
I called patient. The patient is on 200 mg of Mysoline daily currently. She is not gaining much benefit, I will continue to increase the dose as she is not having any side effects on the medication. I will call in a 250 mg Mysoline tablets, have her begin taking one half tablet twice daily.

## 2014-08-10 ENCOUNTER — Encounter: Payer: Self-pay | Admitting: Internal Medicine

## 2014-08-22 ENCOUNTER — Telehealth: Payer: Self-pay

## 2014-08-22 NOTE — Telephone Encounter (Signed)
Faxed [facility] prior authorization request for Xifaxan.  Awaiting response.

## 2014-08-23 NOTE — Telephone Encounter (Signed)
Caller name: Neda Relation to [id number] Call back number: [phone] .  Ask for Neda.   Pharmacy: [facility]  Reason for call:  [facility] called back for clarification regarding any other treatments.

## 2014-08-29 NOTE — Telephone Encounter (Signed)
Spoke with pharmacist and [facility] to check on status of Xifaxan request.  They requested a med list showing patient had tried Xifaxan, which I faxed to them.  Lm message for patient letting her know the updates.

## 2014-08-30 NOTE — Telephone Encounter (Signed)
Spoke with [facility] and was told pt's Xifaxan had been approved and would cost $0.  Pharmacist said she would contact the patient.

## 2014-09-11 ENCOUNTER — Telehealth: Payer: Self-pay | Admitting: Neurology

## 2014-09-11 MED ORDER — PRIMIDONE 250 MG PO TABS: ORAL_TABLET | ORAL | Status: DC

## 2014-09-11 NOTE — Telephone Encounter (Signed)
Patient called requesting to speak with a nurse regarding her tremors still acting up. She thinks that maybe her dosage for medications need to be adjusted again. Please call and advise. Patient can be reached @ [phone]

## 2014-09-11 NOTE — Telephone Encounter (Signed)
I called the patient. She stated that she is currently taking the 125 mg of Primidone twice daily, as Dr. Jannifer Franklin prescribed and her PCP prescribed Inderal 80 mg daily to help with her palpitations, which usually helps with her tremors. She stated she is still having tremors though. The tremors were so bad today she dropped food in the floor off the plate she was carrying. She wondered if the Primidone could be increased at all?

## 2014-09-11 NOTE — Telephone Encounter (Signed)
I called patient. She is still having problems with tremors. We can go to the dose of 125 mg the morning, 250 mg in the evening. She claims that blood levels of Primidone were recently drawn.

## 2014-09-14 ENCOUNTER — Telehealth: Payer: Self-pay | Admitting: Neurology

## 2014-09-14 NOTE — Telephone Encounter (Signed)
The patient has had blood work done through her primary care doctor, Dr. Forde Dandy. The carbamazepine level was slightly low at 3.1, primidone level slightly low at 3.4, phenobarbital level slightly low at 12. Chemistry panel and liver panel were unremarkable. CBC was unremarkable. MCV was 103.3. The patient has a history of seizures that have been very well controlled, no dose adjustments in medications needed. She is also being treated for an essential tremor. The primidone can be increased if needed for this purpose.

## 2014-10-03 ENCOUNTER — Encounter: Payer: Self-pay | Admitting: Internal Medicine

## 2014-10-03 ENCOUNTER — Ambulatory Visit (INDEPENDENT_AMBULATORY_CARE_PROVIDER_SITE_OTHER): Payer: Medicare Other | Admitting: Internal Medicine

## 2014-10-03 VITALS — BP 86/52 | HR 75 | Ht 61.0 in | Wt 128.0 lb

## 2014-10-03 DIAGNOSIS — Z86010 Personal history of colonic polyps: Secondary | ICD-10-CM

## 2014-10-03 DIAGNOSIS — R634 Abnormal weight loss: Secondary | ICD-10-CM

## 2014-10-03 DIAGNOSIS — R112 Nausea with vomiting, unspecified: Secondary | ICD-10-CM

## 2014-10-03 DIAGNOSIS — K6389 Other specified diseases of intestine: Secondary | ICD-10-CM | POA: Diagnosis not present

## 2014-10-03 DIAGNOSIS — R1084 Generalized abdominal pain: Secondary | ICD-10-CM

## 2014-10-03 DIAGNOSIS — Z9884 Bariatric surgery status: Secondary | ICD-10-CM

## 2014-10-03 NOTE — Patient Instructions (Signed)
You have been scheduled for a CT scan of the abdomen and pelvis at [facility] ([address]---this is in the same building as [facility]).   You are scheduled on 10/08/2014 at 1:00pm. You should arrive 15 minutes prior to your appointment time for registration. Please follow the written instructions below on the day of your exam:  WARNING: IF YOU ARE ALLERGIC TO IODINE/X-RAY DYE, PLEASE NOTIFY RADIOLOGY IMMEDIATELY AT [phone]! YOU WILL BE GIVEN A 13 HOUR PREMEDICATION PREP.  1) Do not eat or drink anything after 9:00am (4 hours prior to your test) 2) You have been given 2 bottles of oral contrast to drink. The solution may taste better if refrigerated, but do NOT add ice or any other liquid to this solution. Shake well before drinking.    Drink 1 bottle of contrast @ 11:00am (2 hours prior to your exam)  Drink 1 bottle of contrast @ 12:00pm (1 hour prior to your exam)  You may take any medications as prescribed with a small amount of water except for the following: Metformin, Glucophage, Glucovance, Avandamet, Riomet, Fortamet, Actoplus Met, Janumet, Glumetza or Metaglip. The above medications must be held the day of the exam AND 48 hours after the exam.  The purpose of you drinking the oral contrast is to aid in the visualization of your intestinal tract. The contrast solution may cause some diarrhea. Before your exam is started, you will be given a small amount of fluid to drink. Depending on your individual set of symptoms, you may also receive an intravenous injection of x-ray contrast/dye. Plan on being at [facility] for 30 minutes or long, depending on the type of exam you are having performed.  If you have any questions regarding your exam or if you need to reschedule, you may call the CT department at [phone] between the hours of 8:00 am and 5:00 pm, Monday-Friday.  ________________________________________________________________________

## 2014-10-03 NOTE — Progress Notes (Signed)
HISTORY OF PRESENT ILLNESS:  [name] is a 54 y.o. female with diabetes mellitus, seizure disorder, asthma, prior Roux-en-Y gastric bypass surgery approximately 10 years ago. Prior upper endoscopy May 2012 was unremarkable postoperative exam. Colonoscopy 2012 revealed mild diverticulosis and small tubular adenoma. She has been seen in the past  with complaints of bloating, loose stools, and foul-smelling gas. Testing for celiac disease has been negative. Periodic courses of Xifaxan have been helpful. Blood work including comprehensive metabolic panel, C-reactive protein, sedimentation rate, vitamin K, vitamin D, vitamin C levels have been normal. Qualitative fecal fat normal. Pancreatic elastase normal. She did undergo upper GI small bowel follow-through 03/29/2014. No evidence for blind loop syndrome or other issues postop. Abdominal ultrasound for right upper quadrant pain was negative except for incidental horseshoe kidney. She does use chronic opiates for chronic back pain. She has required escalating doses in recent months. There are plans for her to visit her neurosurgeon and/or a pain clinic. Her last office evaluation was 05/07/2014. See that dictation for details. She was treated with Xifaxan. She does take pancreatic enzyme supplements despite negative workup for pancreatic insufficiency. She has had improvement in her diarrhea since her last visit. Chief complaint today is that of intermittent abdominal pain, nausea, and vomiting. She continues with weight loss, which concerns her. Abdominal symptoms seem to be exacerbated by meals. She does feel that she is eating less over time. No hematemesis, melena, or hematochezia. Chronic back pain as noted.  REVIEW OF SYSTEMS:  All non-GI ROS negative except for back pain and depression  Past Medical History  Diagnosis Date  . Seizure disorder   . Diabetes mellitus   . Hyperlipidemia   . Depression   . Chronic back pain     spinal stenosis   . Hypothyroidism   . Hypertension   . Anemia   . Anxiety   . Asthma   . Neurogenic bladder   . Myoclonic epilepsy   . Tremor   . Fatty liver   . Hx of colonic polyps     adenomatous  . Diverticulosis   . Essential tremor 06/20/2014    Past Surgical History  Procedure Laterality Date  . Gastric bypass  2005  . Neck fusion  1998  . Abdominal hysterectomy    . Tubal ligation    . Panniculectomy  2010  . Carpal tunnel release Left   . Nerve surgery      left ulnar nerve transposition  . Tonsillectomy      Social History [name]  reports that she has never smoked. She has never used smokeless tobacco. She reports that she drinks alcohol. She reports that she does not use illicit drugs.  family history includes Colon cancer in her maternal aunt and maternal uncle; Coronary artery disease in her sister; Diabetes in an other family member; Heart attack (age of onset: 33) in her father; Heart disease in her mother and another family member; Kidney disease in her mother; Other in her son; Seizures in her sister; Tremor in her mother. There is no history of Liver disease.  Allergies  Allergen Reactions  . Statins Other (See Comments)    Major headaches  . Sulfa Antibiotics Other (See Comments)    Yeast and thrush infections with just one dose       PHYSICAL EXAMINATION: Vital signs: BP 86/52 mmHg  Pulse 75  Ht 5\' 1"  (1.549 m)  Wt 128 lb (58.06 kg)  BMI 24.20 kg/m2 General: Well-developed,  well-nourished, no acute distress HEENT: Sclerae are anicteric, conjunctiva pink. Oral mucosa intact Lungs: Clear Heart: Regular Abdomen: soft, mild tenderness with palpation in the midportion, nondistended, no obvious ascites, no peritoneal signs, normal bowel sounds. No organomegaly. No incisional hernia Extremities: No edema Psychiatric: alert and oriented x3. Cooperative   ASSESSMENT:  #1. Problems with abdominal bloating discomfort, chronic nausea, and intermittent  vomiting. As well, weight loss. I suspect that she has gastrointestinal dysmotility and abdominal complaints related to chronic narcotics. She may have an element of bacterial overgrowth intermittently given her altered GI anatomy from Roux-en-Y gastric bypass surgery remotely. At this point, I think it would be important to rule out organic causes for pain and weight loss such as internal hernia or pancreatic lesion (seems to respond to pancreatic enzymes). If this is negative, then she will continue to work with her primary physicians regarding her chronic neck pain and chronic narcotics. #2. History of alternating bowel habits consistent with IBS #3. 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 History of adenomatous colon polyps on colonoscopy in 2012   PLAN:  #1. Contrast-enhanced CT scan of the abdomen and pelvis #2. On demand Xifaxan for bacterial overgrowth type symptoms. She has several refills available #3. Surveillance colonoscopy around 2017 #4. Ongoing general medical care with Dr. Forde Dandy  25 minutes was spent face-to-face with this patient. Greater than 50% of the time spent counseling regarding her conditions, evaluations, and treatment recommendations

## 2014-10-04 ENCOUNTER — Telehealth: Payer: Self-pay

## 2014-10-04 NOTE — Telephone Encounter (Signed)
-----   Message from Osvaldo Shipper, [place] sent at 10/04/2014  9:22 AM EDT ----- Regarding: Oletha Blend, on [name] problem list it says she is diabetic. She is scheduled for Monday. Could you please put in an order for BMET.  Thanks Erline Levine  [phone]

## 2014-10-04 NOTE — Telephone Encounter (Signed)
Spoke with patient who told me she had had labs drawn recently by Dr. Forde Dandy.  [place] who confirmed BMET was done 09/07/2014.  Faxing a copy to [facility] at [place]

## 2014-10-05 ENCOUNTER — Telehealth: Payer: Self-pay | Admitting: Internal Medicine

## 2014-10-05 NOTE — Telephone Encounter (Signed)
Pt having CT of A/P and reports she has an essential tremor. Pt wanting to know if she will be ok for the CT due to the tremor. Spoke with Lattie Haw at [facility] and she states pt should be fine for CT.

## 2014-10-08 ENCOUNTER — Ambulatory Visit (INDEPENDENT_AMBULATORY_CARE_PROVIDER_SITE_OTHER)
Admission: RE | Admit: 2014-10-08 | Discharge: 2014-10-08 | Disposition: A | Discharge status: Home / Self Care | Payer: Medicare Other | Source: Ambulatory Visit | Attending: Internal Medicine | Admitting: Internal Medicine

## 2014-10-08 DIAGNOSIS — R1084 Generalized abdominal pain: Secondary | ICD-10-CM

## 2014-10-08 DIAGNOSIS — R112 Nausea with vomiting, unspecified: Secondary | ICD-10-CM

## 2014-10-08 DIAGNOSIS — R634 Abnormal weight loss: Secondary | ICD-10-CM

## 2014-10-08 MED ORDER — IOHEXOL 300 MG/ML  SOLN
100.0000 mL | Freq: Once | INTRAMUSCULAR | Status: AC | PRN
  Administered 2014-10-08: 100 mL via INTRAVENOUS

## 2014-12-31 ENCOUNTER — Other Ambulatory Visit: Payer: Self-pay | Admitting: Internal Medicine

## 2015-01-06 ENCOUNTER — Other Ambulatory Visit: Payer: Self-pay | Admitting: Neurology

## 2015-02-26 ENCOUNTER — Other Ambulatory Visit: Payer: Self-pay | Admitting: Cardiovascular Disease

## 2015-02-28 NOTE — Telephone Encounter (Signed)
Since this is not a cardiac drug, I will defer to you.

## 2015-03-01 NOTE — Telephone Encounter (Signed)
diocyclomine refill refused - defer to GI Dr. Henrene Pastor

## 2015-03-06 ENCOUNTER — Other Ambulatory Visit: Payer: Self-pay | Admitting: Internal Medicine

## 2015-03-14 ENCOUNTER — Other Ambulatory Visit: Payer: Self-pay | Admitting: Physician Assistant

## 2015-03-28 ENCOUNTER — Other Ambulatory Visit: Payer: Self-pay

## 2015-03-28 MED ORDER — PANCRELIPASE (LIP-PROT-AMYL) 12000-38000 UNITS PO CPEP: ORAL_CAPSULE | ORAL | Status: DC

## 2015-04-04 ENCOUNTER — Other Ambulatory Visit: Payer: Self-pay | Admitting: Neurology

## 2015-04-12 ENCOUNTER — Other Ambulatory Visit: Payer: Self-pay | Admitting: Physician Assistant

## 2015-05-01 ENCOUNTER — Ambulatory Visit (INDEPENDENT_AMBULATORY_CARE_PROVIDER_SITE_OTHER): Payer: Medicare Other | Admitting: Neurology

## 2015-05-01 ENCOUNTER — Encounter: Payer: Self-pay | Admitting: Neurology

## 2015-05-01 VITALS — BP 104/71 | HR 69 | Ht 61.0 in | Wt 118.5 lb

## 2015-05-01 DIAGNOSIS — R55 Syncope and collapse: Secondary | ICD-10-CM | POA: Diagnosis not present

## 2015-05-01 DIAGNOSIS — G40909 Epilepsy, unspecified, not intractable, without status epilepticus: Secondary | ICD-10-CM

## 2015-05-01 DIAGNOSIS — G250 Essential tremor: Secondary | ICD-10-CM | POA: Diagnosis not present

## 2015-05-01 DIAGNOSIS — I951 Orthostatic hypotension: Secondary | ICD-10-CM | POA: Diagnosis not present

## 2015-05-01 HISTORY — DX: Orthostatic hypotension: I95.1

## 2015-05-01 HISTORY — DX: Syncope and collapse: R55

## 2015-05-01 MED ORDER — BACLOFEN 10 MG PO TABS: 5.0000 mg | ORAL_TABLET | Freq: Two times a day (BID) | ORAL | Status: DC

## 2015-05-01 MED ORDER — CARBAMAZEPINE ER 300 MG PO CP12: 300.0000 mg | ORAL_CAPSULE | Freq: Two times a day (BID) | ORAL | Status: DC

## 2015-05-01 NOTE — Patient Instructions (Addendum)
We will stop the Tizanidine and start baclofen for the leg cramps. We will go down on the mysoline to 1/2 tablet twice a day. Stop the Bentyl if possible.   Orthostatic Hypotension Orthostatic hypotension is a sudden drop in blood pressure. It happens when you quickly stand up from a seated or lying position. You may feel dizzy or light-headed. This can last for just a few seconds or for up to a few minutes. It is usually not a serious problem. However, if this happens frequently or gets worse, it can be a sign of something more serious. CAUSES  Different things can cause orthostatic hypotension, including:   Loss of body fluids (dehydration).  Medicines that lower blood pressure.  Sudden changes in posture, such as standing up quickly after you have been sitting or lying down.  Taking too much of your medicine. SIGNS AND SYMPTOMS   Light-headedness or dizziness.   Fainting or near-fainting.   A fast heart rate.   Weakness.   Feeling tired (fatigue).  DIAGNOSIS  Your health care provider may do several things to help diagnose your condition and identify the cause. These may include:   Taking a medical history and doing a physical exam.  Checking your blood pressure. Your health care provider will check your blood pressure when you are:  Lying down.  Sitting.  Standing.  Using tilt table testing. In this test, you lie down on a table that moves from a lying position to a standing position. You will be strapped onto the table. This test monitors your blood pressure and heart rate when you are in different positions. TREATMENT  Treatment will vary depending on the cause. Possible treatments include:   Changing the dosage of your medicines.  Wearing compression stockings on your lower legs.  Standing up slowly after sitting or lying down.  Eating more salt.  Eating frequent, small meals.  In some cases, getting IV fluids.  Taking medicine to enhance fluid  retention. HOME CARE INSTRUCTIONS  Only take over-the-counter or prescription medicines as directed by your health care provider.  Follow your health care provider's instructions for changing the dosage of your current medicines.  Do not stop or adjust your medicine on your own.  Stand up slowly after sitting or lying down. This allows your body to adjust to the different position.  Wear compression stockings as directed.  Eat extra salt as directed.  Do not add extra salt to your diet unless directed to by your health care provider.  Eat frequent, small meals.  Avoid standing suddenly after eating.  Avoid hot showers or excessive heat as directed by your health care provider.  Keep all follow-up appointments. SEEK MEDICAL CARE IF:  You continue to feel dizzy or light-headed after standing.  You feel groggy or confused.  You feel cold, clammy, or sick to your stomach (nauseous).  You have blurred vision.  You feel short of breath. SEEK IMMEDIATE MEDICAL CARE IF:   You faint after standing.  You have chest pain.  You have difficulty breathing.   You lose feeling or movement in your arms or legs.   You have slurred speech or difficulty talking, or you are unable to talk.  MAKE SURE YOU:   Understand these instructions.  Will watch your condition.  Will get help right away if you are not doing well or get worse.   This information is not intended to replace advice given to you by your health care provider. Make  sure you discuss any questions you have with your health care provider.   Document Released: 03/20/2002 Document Revised: 04/04/2013 Document Reviewed: 01/20/2013 Elsevier Interactive Patient Education [employer].
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
## 2015-05-07 ENCOUNTER — Other Ambulatory Visit: Payer: Self-pay | Admitting: Neurology

## 2015-05-08 NOTE — Telephone Encounter (Signed)
Per last OV note: The Mysoline will be dropped to taking one half of a 250 mg tablet twice daily.

## 2015-05-09 ENCOUNTER — Telehealth: Payer: Self-pay | Admitting: Neurology

## 2015-05-09 MED ORDER — BACLOFEN 10 MG PO TABS: 5.0000 mg | ORAL_TABLET | Freq: Two times a day (BID) | ORAL | Status: DC

## 2015-05-09 NOTE — Telephone Encounter (Signed)
[name] with [facility] called for refill for baclofen (LIORESAL) 10 MG tablet .

## 2015-05-14 ENCOUNTER — Telehealth: Payer: Self-pay

## 2015-05-14 MED ORDER — DICYCLOMINE HCL 10 MG PO CAPS: 10.0000 mg | ORAL_CAPSULE | Freq: Every morning | ORAL | Status: DC

## 2015-05-14 NOTE — Telephone Encounter (Signed)
Sent Bentyl to [facility]

## 2015-05-31 ENCOUNTER — Telehealth: Payer: Self-pay | Admitting: Internal Medicine

## 2015-06-03 NOTE — Telephone Encounter (Signed)
Not sure why she's asking me? No obvious reason why not , from GI perspective, but the ultimate decision is between her and her prescribing physician

## 2015-06-03 NOTE — Telephone Encounter (Signed)
Pt having a Rosacea flare and her dermatologist would like to prescribe doxycyline. Pt wants to know if Dr. Henrene Pastor thinks this is ok for her to take. Please advise.

## 2015-06-03 NOTE — Telephone Encounter (Signed)
Pt aware.

## 2015-07-02 ENCOUNTER — Other Ambulatory Visit: Payer: Self-pay | Admitting: Neurology

## 2015-08-20 ENCOUNTER — Other Ambulatory Visit: Payer: Self-pay

## 2015-08-20 MED ORDER — CARBAMAZEPINE ER 300 MG PO CP12: 300.0000 mg | ORAL_CAPSULE | Freq: Two times a day (BID) | ORAL | Status: DC

## 2015-08-20 NOTE — Telephone Encounter (Signed)
Received faxed request from pharmacy for 90 day supply. New rx w/ increased qty retailed as requested.

## 2015-08-28 ENCOUNTER — Ambulatory Visit (INDEPENDENT_AMBULATORY_CARE_PROVIDER_SITE_OTHER): Payer: Medicare Other | Admitting: Neurology

## 2015-08-28 ENCOUNTER — Encounter: Payer: Self-pay | Admitting: Neurology

## 2015-08-28 VITALS — BP 87/58 | HR 68 | Ht 61.25 in | Wt 119.0 lb

## 2015-08-28 DIAGNOSIS — I951 Orthostatic hypotension: Secondary | ICD-10-CM | POA: Diagnosis not present

## 2015-08-28 DIAGNOSIS — Z5181 Encounter for therapeutic drug level monitoring: Secondary | ICD-10-CM | POA: Diagnosis not present

## 2015-08-28 DIAGNOSIS — G250 Essential tremor: Secondary | ICD-10-CM | POA: Diagnosis not present

## 2015-08-28 DIAGNOSIS — G40909 Epilepsy, unspecified, not intractable, without status epilepticus: Secondary | ICD-10-CM | POA: Diagnosis not present

## 2015-08-28 MED ORDER — TOPIRAMATE 25 MG PO TABS: 25.0000 mg | ORAL_TABLET | Freq: Two times a day (BID) | ORAL | Status: DC

## 2015-08-28 NOTE — Progress Notes (Signed)
Reason for visit: Tremor  [name] is an 55 y.o. female  History of present illness:  [name] is a 55 year old right-handed white female with a history of what appears to be an essential tremor. The patient does have a family history of this, her mother has a similar tremor. The tremor affects the left greater than right upper extremity, and the patient has a side-to-side head tremor. The patient has difficulty playing her guitar. She has been seen by Dr. Carles Bowman for an opinion regarding a deep brain stimulator, she was not felt to be a candidate secondary to a history of traumatic brain injury. The patient has some chronic back pain, she has had a trial spinal stimulator placed with good improvement, a permanent spinal stimulator may be placed in the near future. The patient has had issues previously with orthostatic hypotension with episodes of blacking out. The patient has not had any significant issues in this regard recently. The patient does have some chronic baseline gait instability, she has not had any falls since last seen, but she may stumble. She returns to this office for further evaluation. She is on Mysoline for the tremor, she takes carbamazepine for her history of seizures, she has not had any recent seizures. The last seizure was in 1994.  Past Medical History  Diagnosis Date  . Seizure disorder ([place])   . Hyperlipidemia   . Depression   . Chronic back pain     spinal stenosis  . Hypothyroidism   . Hypertension   . Anemia   . Anxiety   . Asthma   . Neurogenic bladder   . 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 Tremor   . Fatty liver   . Hx of colonic polyps     adenomatous  . Diverticulosis   . Essential tremor 06/20/2014  . Diabetes mellitus     diet controlled  . Pseudobulbar affect   . Syncope, near 05/01/2015  . Orthostatic hypotension 05/01/2015    Past Surgical History  Procedure Laterality Date  . Gastric bypass  2005  . Neck fusion  1998  . Abdominal  hysterectomy    . Tubal ligation    . Panniculectomy  2010  . Carpal tunnel release Left   . Nerve surgery      left ulnar nerve transposition  . Tonsillectomy      Family History  Problem Relation Age of Onset  . Liver disease Neg Hx   . Heart disease Mother   . Kidney disease Mother   . Tremor Mother   . Heart attack Father 36  . Coronary artery disease Sister     x 2  . Seizures Sister   . Colon cancer Maternal Uncle   . Colon cancer Maternal Aunt   . Diabetes    . Heart disease      paternal side  . Other Son     fatty liver    Social history:  reports that she has never smoked. She has never used smokeless tobacco. She reports that she does not drink alcohol or use illicit drugs.    Allergies  Allergen Reactions  . Lidocaine Anaphylaxis  . Statins Other (See Comments)    Major headaches  . Sulfa Antibiotics Other (See Comments)    Yeast and thrush infections with just one dose    Medications:  Prior to Admission medications   Medication Sig Start Date End Date Taking? Authorizing Provider  albuterol (VENTOLIN HFA) 108 (90  BASE) MCG/ACT inhaler Inhale 2 puffs into the lungs every 6 (six) hours as needed.     Yes Historical Provider, MD  atorvastatin (LIPITOR) 20 MG tablet Take 20 mg by mouth 2 (two) times a week.   Yes Historical Provider, MD  baclofen (LIORESAL) 10 MG tablet Take one-half tablet by  mouth two times daily 07/02/15  Yes Kathrynn Ducking, MD  BIOTIN PO Place 10,000 mcg under the tongue daily.   Yes Historical Provider, MD  carbamazepine (CARBATROL) 300 MG 12 hr capsule Take 1 capsule (300 mg total) by mouth 2 (two) times daily. 08/20/15  Yes Kathrynn Ducking, MD  desvenlafaxine (PRISTIQ) 100 MG 24 hr tablet Take 100 mg by mouth at bedtime.    Yes Historical Provider, MD  dicyclomine (BENTYL) 10 MG capsule Take 1 capsule (10 mg total) by mouth every morning. 05/14/15  Yes Irene Shipper, MD  DULoxetine (CYMBALTA) 30 MG capsule Take 30 mg by mouth every  evening. 05/15/14  Yes Historical Provider, MD  DULoxetine (CYMBALTA) 60 MG capsule Take 60 mg by mouth daily.   Yes Historical Provider, MD  estradiol (ESTRACE) 1 MG tablet Take 1 mg by mouth daily.  09/05/12  Yes Historical Provider, MD  fentaNYL (DURAGESIC - DOSED MCG/HR) 75 MCG/HR Place 75 mcg onto the skin every 3 (three) days.   Yes Historical Provider, MD  fluticasone (FLONASE) 50 MCG/ACT nasal spray Place 2 sprays into both nostrils every morning.  09/13/12  Yes Historical Provider, MD  levothyroxine (SYNTHROID, LEVOTHROID) 100 MCG tablet Take 100 mcg by mouth daily. Take 1 tablet by mouth for 6 days of the week, none on Sunday   Yes Historical Provider, MD  lipase/protease/amylase (CREON) 12000 UNITS CPEP capsule ONE TABLET WITH MEALS AND SNACKS 03/28/15  Yes Irene Shipper, MD  Loperamide HCl (IMODIUM PO) Take by mouth as needed.   Yes Historical Provider, MD  loratadine (CLARITIN) 10 MG tablet Take 10 mg by mouth every morning.   Yes Historical Provider, MD  Methylcobalamin (METHYL B-12 PO) Place 5,000 mcg under the tongue daily.   Yes Historical Provider, MD  metoprolol tartrate (LOPRESSOR) 25 MG tablet Take 12.5 mg by mouth 2 (two) times daily.  07/15/15  Yes Historical Provider, MD  minocycline (MINOCIN,DYNACIN) 50 MG capsule Take 50 mg by mouth as needed. 04/30/14  Yes Historical Provider, MD  nitroGLYCERIN (NITROSTAT) 0.4 MG SL tablet Place 0.4 mg under the tongue every 5 (five) minutes as needed.     Yes Historical Provider, MD  oxyCODONE-acetaminophen (PERCOCET/ROXICET) 5-325 MG per tablet Take 1 tablet by mouth 4 (four) times daily as needed for severe pain.   Yes Historical Provider, MD  pantoprazole (PROTONIX) 40 MG tablet Take 1 tablet by mouth  daily 04/12/15  Yes Irene Shipper, MD  primidone (MYSOLINE) 250 MG tablet Take 0.5 tablets (125 mg total) by mouth 2 (two) times daily. 05/08/15  Yes Kathrynn Ducking, MD  rifaximin (XIFAXAN) 550 MG TABS tablet Take 1 tablet (550 mg total) by mouth  2 (two) times daily. Patient taking differently: Take 550 mg by mouth 2 (two) times daily as needed.  05/07/14  Yes Irene Shipper, MD  Saccharomyces boulardii (FLORASTOR PO) Take 2 capsules by mouth 2 (two) times daily.   Yes Historical Provider, MD    ROS:  Out of a complete 14 system review of symptoms, the patient complains only of the following symptoms, and all other reviewed systems are negative.  Blurred vision Heat  intolerance Constipation Insomnia Sleep talking Environmental allergies Difficulty urinating Joint pain, back pain, achy muscles, walking difficulty, neck pain, neck stiffness Anemia Headache, numbness, tremors, dizziness  Blood pressure 87/58, pulse 68, height 5' 1.25" (1.556 m), weight 119 lb (53.978 kg).   Blood pressure, right arm, standing is [id number] systolic/palpable.  Physical Exam  General: The patient is alert and cooperative at the time of the examination.  Skin: No significant peripheral edema is noted.   Neurologic Exam  Mental status: The patient is alert and oriented x 3 at the time of the examination. The patient has apparent normal recent and remote memory, with an apparently normal attention span and concentration ability.   Cranial nerves: Facial symmetry is present. Speech is normal, no aphasia or dysarthria is noted. Extraocular movements are full. Visual fields are full. The patient has a side-to-side head tremor.  Motor: The patient has good strength in all 4 extremities.  Sensory examination: Soft touch sensation is symmetric on the face, arms, and legs.  Coordination: The patient has good finger-nose-finger and heel-to-shin bilaterally. The patient has action tremors bilaterally with both upper extremities, tremor is slightly worse on the left.  Gait and station: The patient has a normal gait. Tandem gait is normal. Romberg is negative. No drift is seen.  Reflexes: Deep tendon reflexes are symmetric.   Assessment/Plan:  1.  Essential tremor  2. History of seizures, well controlled  3. Orthostatic hypotension  The patient has done better with the symptoms of her orthostatic hypotension, no further episodes of falling or loss of consciousness. She still is running somewhat low blood pressures. The patient is having a worsening tremor as the Mysoline dosing was cut back because of drowsiness. We will give a trial on low-dose Topamax to see if this helps. The patient may desire another opinion concerning a spinal stimulator through [facility] in the future. Otherwise, she will follow-up in 6 months. Blood work will be done today.  Jill Alexanders MD 08/28/2015 1:10 PM  [facility] [address] [place], [place] [address]  Phone [phone] Fax [phone]

## 2015-08-28 NOTE — Patient Instructions (Signed)
  Topamax (topiramate) is a seizure medication that has an FDA approval for seizures and for migraine headache. Potential side effects of this medication include weight loss, cognitive slowing, tingling in the fingers and toes, and carbonated drinks will taste bad. If any significant side effects are noted on this drug, please contact our office.  Start Topamax at 25 mg at night for one week, then go to 25 mg twice a day.  Tremor A tremor is trembling or shaking that you cannot control. Most tremors affect the hands or arms. Tremors can also affect the head, vocal cords, face, and other parts of the body. There are many types of tremors. Common types include:   Essential tremor. These usually occur in people over the age of 98. It may run in families and can happen in otherwise healthy people.   Resting tremor. These occur when the muscles are at rest, such as when your hands are resting in your lap. People with Parkinson disease often have resting tremors.   Postural tremor. These occur when you try to hold a pose, such as keeping your hands outstretched.   Kinetic tremor. These occur during purposeful movement, such as trying to touch a finger to your nose.   Task-specific tremor. These may occur when you perform tasks such as handwriting, speaking, or standing.   Psychogenic tremor. These dramatically lessen or disappear when you are distracted. They can happen in people of all ages.  Some types of tremors have no known cause. Tremors can also be a symptom of nervous system problems (neurological disorders) that may occur with aging. Some tremors go away with treatment while others do not.  HOME CARE INSTRUCTIONS Watch your tremor for any changes. The following actions may help to lessen any discomfort you are feeling:  Take medicines only as directed by your health care provider.   Limit alcohol intake to no more than 1 drink per day for nonpregnant women and 2 drinks per day for  men. One drink equals 12 oz of beer, 5 oz of wine, or 1 oz of hard liquor.  Do not use any tobacco products, including cigarettes, chewing tobacco, or electronic cigarettes. If you need help quitting, ask your health care provider.   Avoid extreme heat or cold.   Limit the amount of caffeine you consumeas directed by your health care provider.   Try to get 8 hours of sleep each night.  Find ways to manage your stress, such as meditation or yoga.  Keep all follow-up visits as directed by your health care provider. This is important. SEEK MEDICAL CARE IF:  You start having a tremor after starting a new medicine.  You have tremor with other symptoms such as:  Numbness.  Tingling.  Pain.  Weakness.  Your tremor gets worse.  Your tremor interferes with your day-to-day life.   This information is not intended to replace advice given to you by your health care provider. Make sure you discuss any questions you have with your health care provider.   Document Released: 03/20/2002 Document Revised: 04/20/2014 Document Reviewed: 09/25/2013 Elsevier Interactive Patient Education [employer].

## 2015-08-30 ENCOUNTER — Telehealth: Payer: Self-pay | Admitting: Neurology

## 2015-08-30 ENCOUNTER — Other Ambulatory Visit (INDEPENDENT_AMBULATORY_CARE_PROVIDER_SITE_OTHER): Payer: Self-pay

## 2015-08-30 DIAGNOSIS — Z5181 Encounter for therapeutic drug level monitoring: Secondary | ICD-10-CM

## 2015-08-30 DIAGNOSIS — Z0289 Encounter for other administrative examinations: Secondary | ICD-10-CM

## 2015-08-30 LAB — CBC WITH DIFFERENTIAL/PLATELET
BASOS ABS: 0.1 10*3/uL (ref 0.0–0.2)
BASOS: 2 %
EOS (ABSOLUTE): 0.2 10*3/uL (ref 0.0–0.4)
EOS: 5 %
HEMOGLOBIN: 11.9 g/dL (ref 11.1–15.9)
Hematocrit: 35.8 % (ref 34.0–46.6)
Immature Grans (Abs): 0 10*3/uL (ref 0.0–0.1)
Immature Granulocytes: 0 %
LYMPHS ABS: 1.6 10*3/uL (ref 0.7–3.1)
Lymphs: 35 %
MCH: 32.2 pg (ref 26.6–33.0)
MCHC: 33.2 g/dL (ref 31.5–35.7)
MCV: 97 fL (ref 79–97)
MONOCYTES: 6 %
MONOS ABS: 0.3 10*3/uL (ref 0.1–0.9)
NEUTROS ABS: 2.3 10*3/uL (ref 1.4–7.0)
Neutrophils: 52 %
Platelets: 208 10*3/uL (ref 150–379)
RBC: 3.7 x10E6/uL — ABNORMAL LOW (ref 3.77–5.28)
RDW: 13.4 % (ref 12.3–15.4)
WBC: 4.4 10*3/uL (ref 3.4–10.8)

## 2015-08-30 LAB — COMPREHENSIVE METABOLIC PANEL
ALBUMIN: 4 g/dL (ref 3.5–5.5)
ALT: 11 IU/L (ref 0–32)
AST: 14 IU/L (ref 0–40)
Albumin/Globulin Ratio: 2 (ref 1.2–2.2)
Alkaline Phosphatase: 75 IU/L (ref 39–117)
BUN / CREAT RATIO: 23 (ref 9–23)
BUN: 18 mg/dL (ref 6–24)
CHLORIDE: 102 mmol/L (ref 96–106)
CO2: 24 mmol/L (ref 18–29)
CREATININE: 0.77 mg/dL (ref 0.57–1.00)
Calcium: 8.4 mg/dL — ABNORMAL LOW (ref 8.7–10.2)
GFR, EST AFRICAN AMERICAN: 100 mL/min/{1.73_m2} (ref >59)
GFR, EST NON AFRICAN AMERICAN: 87 mL/min/{1.73_m2} (ref >59)
GLUCOSE: 92 mg/dL (ref 65–99)
Globulin, Total: 2 g/dL (ref 1.5–4.5)
Potassium: 5.8 mmol/L — ABNORMAL HIGH (ref 3.5–5.2)
Sodium: 139 mmol/L (ref 134–144)
TOTAL PROTEIN: 6 g/dL (ref 6.0–8.5)

## 2015-08-30 LAB — PRIMIDONE, SERUM
Phenobarbital, Serum: 13 ug/mL — ABNORMAL LOW (ref 15–40)
Primidone Lvl: 7 ug/mL (ref 5.0–12.0)

## 2015-08-30 LAB — CARBAMAZEPINE LEVEL, TOTAL: CARBAMAZEPINE LVL: 6.6 ug/mL (ref 4.0–12.0)

## 2015-08-30 NOTE — Telephone Encounter (Signed)
I called patient. Blood work is unremarkable with exception that the calcium level slightly low and the potassium level is elevated. The potassium elevation may be related to the blood draw itself. We will recheck in the next several days to confirm. I discussed this with patient.

## 2015-08-31 LAB — BASIC METABOLIC PANEL
BUN/Creatinine Ratio: 22 (ref 9–23)
BUN: 18 mg/dL (ref 6–24)
CO2: 23 mmol/L (ref 18–29)
CREATININE: 0.83 mg/dL (ref 0.57–1.00)
Calcium: 8.6 mg/dL — ABNORMAL LOW (ref 8.7–10.2)
Chloride: 106 mmol/L (ref 96–106)
GFR calc Af Amer: 91 mL/min/{1.73_m2} (ref >59)
GFR calc non Af Amer: 79 mL/min/{1.73_m2} (ref >59)
GLUCOSE: 97 mg/dL (ref 65–99)
Potassium: 5.4 mmol/L — ABNORMAL HIGH (ref 3.5–5.2)
Sodium: 141 mmol/L (ref 134–144)

## 2015-09-10 ENCOUNTER — Other Ambulatory Visit: Payer: Self-pay | Admitting: Neurology

## 2015-09-16 ENCOUNTER — Telehealth: Payer: Self-pay | Admitting: Neurology

## 2015-09-16 DIAGNOSIS — R251 Tremor, unspecified: Secondary | ICD-10-CM

## 2015-09-16 NOTE — Telephone Encounter (Signed)
Pt called said she would like to proceed with referral to [facility] for essential tremors for deep brain stimulation- 2nd opinion. She said the topiramate (TOPAMAX) 25 MG tablet  made her feel "loopy" and cannot function.

## 2015-09-16 NOTE — Telephone Encounter (Signed)
I called patient. The patient could not tolerate the Topamax, she'll stop the medication. The patient desires to have a second opinion from Dr. Hillery Hunter concerning her tremors.

## 2015-09-23 ENCOUNTER — Encounter: Payer: Self-pay | Admitting: Internal Medicine

## 2015-09-27 ENCOUNTER — Other Ambulatory Visit: Payer: Self-pay | Admitting: Internal Medicine

## 2015-10-04 ENCOUNTER — Other Ambulatory Visit: Payer: Self-pay | Admitting: Internal Medicine

## 2015-11-06 ENCOUNTER — Other Ambulatory Visit: Payer: Self-pay | Admitting: Neurology

## 2015-11-22 ENCOUNTER — Other Ambulatory Visit: Payer: Self-pay | Admitting: Internal Medicine

## 2015-11-22 MED ORDER — DICYCLOMINE HCL 10 MG PO CAPS: 10.0000 mg | ORAL_CAPSULE | Freq: Every morning | ORAL | 0 refills | Status: DC

## 2015-11-22 NOTE — Telephone Encounter (Signed)
Rx sent 

## 2015-11-27 ENCOUNTER — Ambulatory Visit (INDEPENDENT_AMBULATORY_CARE_PROVIDER_SITE_OTHER): Payer: Medicare Other | Admitting: Internal Medicine

## 2015-11-27 ENCOUNTER — Encounter: Payer: Self-pay | Admitting: Internal Medicine

## 2015-11-27 VITALS — BP 100/56 | HR 84 | Ht 61.0 in | Wt 118.1 lb

## 2015-11-27 DIAGNOSIS — K591 Functional diarrhea: Secondary | ICD-10-CM

## 2015-11-27 DIAGNOSIS — Z86010 Personal history of colonic polyps: Secondary | ICD-10-CM | POA: Diagnosis not present

## 2015-11-27 DIAGNOSIS — R1084 Generalized abdominal pain: Secondary | ICD-10-CM

## 2015-11-27 MED ORDER — NA SULFATE-K SULFATE-MG SULF 17.5-3.13-1.6 GM/177ML PO SOLN: 1.0000 | Freq: Once | ORAL | 0 refills | Status: AC

## 2015-11-27 MED ORDER — DICYCLOMINE HCL 10 MG PO CAPS: 10.0000 mg | ORAL_CAPSULE | Freq: Every morning | ORAL | 11 refills | Status: DC

## 2015-11-27 NOTE — Patient Instructions (Signed)
You have been scheduled for a colonoscopy. Please follow written instructions given to you at your visit today.  Please pick up your prep supplies at the pharmacy within the next 1-3 days. If you use inhalers (even only as needed), please bring them with you on the day of your procedure.   We have sent the following medications to your pharmacy for you to pick up at your convenience:  Bentyl

## 2015-11-27 NOTE — Progress Notes (Signed)
HISTORY OF PRESENT ILLNESS:  [name] is a 55 y.o. female with diabetes mellitus, seizure disorder, asthma, chronic back pain on narcotics, and prior Roux-en-Y gastric bypass surgery approximately 11 years ago. She was last evaluated in the office 10/03/2014. See that dictation for details. Problems at that time were abdominal bloating discomfort, chronic nausea, and intermittent vomiting. As well weight loss. 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 Subsequent CT scan was negative for relevant abdominal problems. Trial of Xifaxan had been helpful. Previous upper endoscopy with postoperative changes only. Last colonoscopy May 2012. Tubular adenoma. Due for follow-up in 5 years. She wants to schedule that exam. Shows tell me that she has intermittent problems with loose stools and abdominal cramping discomfort. Recently had Bentyl refilled. This helps. More recent problems with hoarseness despite PPI. She has not seen ENT. She has had recent placement of spinal cord stimulator. She states this has helped her pain and has allowed her to decrease her narcotics. Also being evaluated by neurology for tremor.  REVIEW OF SYSTEMS:  All non-GI ROS negative except for allergies, arthritis, back pain, cough, fatigue, muscle cramps, hoarseness, chronic fatigue, sleeping problems  Past Medical History:  Diagnosis Date  . Anemia   . Anxiety   . Asthma   . Chronic back pain    spinal stenosis  . Depression   . Diabetes mellitus    diet controlled  . Diverticulosis   . Essential tremor 06/20/2014  . Fatty liver   . Hx of colonic polyps    adenomatous  . Hyperlipidemia   . Hypertension   . Hypothyroidism   . 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 Neurogenic bladder   . Orthostatic hypotension 05/01/2015  . Pseudobulbar affect   . Seizure disorder ([place])   . Syncope, near 05/01/2015  . Tremor     Past Surgical History:  Procedure Laterality Date  . ABDOMINAL  HYSTERECTOMY    . CARPAL TUNNEL RELEASE Left   . GASTRIC BYPASS  2005  . Neck Fusion  1998  . NERVE SURGERY     left ulnar nerve transposition  . PANNICULECTOMY  2010  . SPINAL CORD STIMULATOR IMPLANT    . TONSILLECTOMY    . TUBAL LIGATION      Social History [name]  reports that she has never smoked. She has never used smokeless tobacco. She reports that she does not drink alcohol or use drugs.  family history includes Colon cancer in her maternal aunt and maternal uncle; Coronary artery disease in her sister; Heart attack (age of onset: 81) in her father; Heart disease in her mother; Kidney disease in her mother; Other in her son; Seizures in her sister; Tremor in her mother.  Allergies  Allergen Reactions  . Lidocaine Anaphylaxis  . Peanut-Containing Drug Products   . Statins Other (See Comments)    Major headaches  . Sulfa Antibiotics Other (See Comments)    Yeast and thrush infections with just one dose       PHYSICAL EXAMINATION: Vital signs: BP (!) 100/56   Pulse 84   Ht 5\' 1"  (1.549 m) Comment: w/o shoes  Wt 118 lb 2 oz (53.6 kg)   BMI 22.32 kg/m   Constitutional: generally well-appearing, no acute distress Psychiatric: alert and oriented x3, cooperative Eyes: extraocular movements intact, anicteric, conjunctiva pink Mouth: oral pharynx moist, no lesions Neck: supple no lymphadenopathy Cardiovascular: heart regular rate and rhythm, no murmur Lungs: clear to auscultation bilaterally Abdomen:  soft, nontender, nondistended, no obvious ascites, no peritoneal signs, normal bowel sounds, no organomegaly Rectal: Deferred until colonoscopy Extremities: no clubbing cyanosis or lower extremity edema bilaterally Skin: no lesions on visible extremities Neuro: Resting tremor upper extremities bilaterally. No focal deficits otherwise.   ASSESSMENT:  #1. GI gut dysmotility. Hopeful for improvement with decrease narcotics #2. Personal history of adenomatous colon  polyps. Due for surveillance #3. Multiple medical problems. Stable   PLAN:  #1. Continue Bentyl as needed for cramping and diarrhea #2. Can reuse Xifaxan if needed for refractory symptoms #3. Schedule surveillance colonoscopy.The nature of the procedure, as well as the risks, benefits, and alternatives were carefully and thoroughly reviewed with the patient. Ample time for discussion and questions allowed. The patient understood, was satisfied, and agreed to proceed.

## 2015-12-02 ENCOUNTER — Ambulatory Visit (AMBULATORY_SURGERY_CENTER): Payer: Medicare Other | Admitting: Internal Medicine

## 2015-12-02 ENCOUNTER — Encounter: Payer: Self-pay | Admitting: Internal Medicine

## 2015-12-02 VITALS — BP 120/68 | HR 60 | Temp 97.8°F | Resp 11 | Ht 61.0 in | Wt 118.0 lb

## 2015-12-02 DIAGNOSIS — D122 Benign neoplasm of ascending colon: Secondary | ICD-10-CM

## 2015-12-02 DIAGNOSIS — Z860100 Personal history of colon polyps, unspecified: Secondary | ICD-10-CM

## 2015-12-02 DIAGNOSIS — D123 Benign neoplasm of transverse colon: Secondary | ICD-10-CM | POA: Diagnosis not present

## 2015-12-02 MED ORDER — SODIUM CHLORIDE 0.9 % IV SOLN: 500.0000 mL | INTRAVENOUS | Status: DC

## 2015-12-02 NOTE — Progress Notes (Signed)
A/ox3 pleased with MAC, report to Sheila RN 

## 2015-12-02 NOTE — Patient Instructions (Signed)
YOU HAD AN ENDOSCOPIC PROCEDURE TODAY AT THE [facility]:   Refer to the procedure report that was given to you for any specific questions about what was found during the examination.  If the procedure report does not answer your questions, please call your gastroenterologist to clarify.  If you requested that your care partner not be given the details of your procedure findings, then the procedure report has been included in a sealed envelope for you to review at your convenience later.  YOU SHOULD EXPECT: Some feelings of bloating in the abdomen. Passage of more gas than usual.  Walking can help get rid of the air that was put into your GI tract during the procedure and reduce the bloating. If you had a lower endoscopy (such as a colonoscopy or flexible sigmoidoscopy) you may notice spotting of blood in your stool or on the toilet paper. If you underwent a bowel prep for your procedure, you may not have a normal bowel movement for a few days.  Please Note:  You might notice some irritation and congestion in your nose or some drainage.  This is from the oxygen used during your procedure.  There is no need for concern and it should clear up in a day or so.  SYMPTOMS TO REPORT IMMEDIATELY:   Following lower endoscopy (colonoscopy or flexible sigmoidoscopy):  Excessive amounts of blood in the stool  Significant tenderness or worsening of abdominal pains  Swelling of the abdomen that is new, acute  Fever of 100F or higher    For urgent or emergent issues, a gastroenterologist can be reached at any hour by calling [phone].   DIET:  We do recommend a small meal at first, but then you may proceed to your regular diet.  Drink plenty of fluids but you should avoid alcoholic beverages for 24 hours.  ACTIVITY:  You should plan to take it easy for the rest of today and you should NOT DRIVE or use heavy machinery until tomorrow (because of the sedation medicines used during the test).     FOLLOW UP: Our staff will call the number listed on your records the next business day following your procedure to check on you and address any questions or concerns that you may have regarding the information given to you following your procedure. If we do not reach you, we will leave a message.  However, if you are feeling well and you are not experiencing any problems, there is no need to return our call.  We will assume that you have returned to your regular daily activities without incident.  If any biopsies were taken you will be contacted by phone or by letter within the next 1-3 weeks.  Please call us at [phone] if you have not heard about the biopsies in 3 weeks.    SIGNATURES/CONFIDENTIALITY: You and/or your care partner have signed paperwork which will be entered into your electronic medical record.  These signatures attest to the fact that that the information above on your After Visit Summary has been reviewed and is understood.  Full responsibility of the confidentiality of this discharge information lies with you and/or your care-partner.   Resume medications. Information given on polyps,diverticulosis and high fiber diet. 

## 2015-12-02 NOTE — Op Note (Signed)
[place] Patient Name: [name] Procedure Date: 12/02/2015 8:36 AM MRN: [id number] Endoscopist: Docia Chuck. Henrene Pastor , MD Age: 55 Referring MD:  Date of Birth: 14-Jun-1960 Gender: Female Account #: [id number] Procedure:                Colonoscopy, with cold snare polypectomy X3 Indications:              Surveillance: Personal history of adenomatous                            polyps on last colonoscopy 5 years ago, High risk                            colon cancer surveillance: Personal history of                            non-advanced adenoma. Normal exam 2004. Small                            adenoma May 2012 Medicines:                Monitored Anesthesia Care Procedure:                Pre-Anesthesia Assessment:                           - Prior to the procedure, a History and Physical                            was performed, and patient medications and                            allergies were reviewed. The patient's tolerance of                            previous anesthesia was also reviewed. The risks                            and benefits of the procedure and the sedation                            options and risks were discussed with the patient.                            All questions were answered, and informed consent                            was obtained. Prior Anticoagulants: The patient has                            taken no previous anticoagulant or antiplatelet                            agents. ASA Grade Assessment: II - A patient with  mild systemic disease. After reviewing the risks                            and benefits, the patient was deemed in                            satisfactory condition to undergo the procedure.                           After obtaining informed consent, the colonoscope                            was passed under direct vision. Throughout the                            procedure, the patient's  blood pressure, pulse, and                            oxygen saturations were monitored continuously. The                            Model CF-HQ190L [id number]) scope was introduced                            through the anus and advanced to the the cecum,                            identified by appendiceal orifice and ileocecal                            valve. The ileocecal valve, appendiceal orifice,                            and rectum were photographed. The quality of the                            bowel preparation was excellent. The colonoscopy                            was performed without difficulty. The patient                            tolerated the procedure well. The bowel preparation                            used was SUPREP. Scope In: 8:45:25 AM Scope Out: 9:01:09 AM Scope Withdrawal Time: 0 hours 11 minutes 44 seconds  Total Procedure Duration: 0 hours 15 minutes 44 seconds  Findings:                 Three polyps were found in the transverse colon and                            ascending colon. The polyps were 2 to 4 mm in size.  These polyps were removed with a cold snare.                            Resection and retrieval were complete.                           A few diverticula were found in the sigmoid colon.                           The entire examined colon appeared normal on direct                            and retroflexion views. Complications:            No immediate complications. Estimated blood loss:                            None. Estimated Blood Loss:     Estimated blood loss: none. Impression:               - Three 2 to 4 mm polyps in the transverse colon                            and in the ascending colon, removed with a cold                            snare. Resected and retrieved.                           - Diverticulosis in the sigmoid colon.                           - The entire examined colon is normal on  direct and                            retroflexion views. Recommendation:           - Repeat colonoscopy in 3 - 5 years for                            surveillance.                           - Patient has a contact number available for                            emergencies. The signs and symptoms of potential                            delayed complications were discussed with the                            patient. Return to normal activities tomorrow.                            Written discharge instructions were  provided to the                            patient.                           - Resume previous diet.                           - Continue present medications.                           - Await pathology results. Docia Chuck. Henrene Pastor, MD 12/02/2015 9:06:35 AM This report has been signed electronically.

## 2015-12-02 NOTE — Progress Notes (Signed)
Called to room to assist during endoscopic procedure.  Patient ID and intended procedure confirmed with present staff. Received instructions for my participation in the procedure from the performing physician.  

## 2015-12-03 ENCOUNTER — Telehealth: Payer: Self-pay | Admitting: *Deleted

## 2015-12-03 NOTE — Telephone Encounter (Signed)
  Follow up Call-  Call back number 12/02/2015  Post procedure Call Back phone  # [phone]  Permission to leave phone message Yes  Some recent data might be hidden     Patient questions:  Do you have a fever, pain , or abdominal swelling? No. Pain Score  0 *  Have you tolerated food without any problems? Yes.    Have you been able to return to your normal activities? Yes.    Do you have any questions about your discharge instructions: Diet   No. Medications  No. Follow up visit  No.  Do you have questions or concerns about your Care? No.  Actions: * If pain score is 4 or above: No action needed, pain <4.

## 2015-12-04 ENCOUNTER — Telehealth: Payer: Self-pay | Admitting: Internal Medicine

## 2015-12-04 IMAGING — US US ABDOMEN COMPLETE
1 series · 14 of 25 positions shown · non-contrast
Comparison: Ultrasound 08/18/2010.

CLINICAL DATA: Right upper quadrant pain .

EXAM:
ULTRASOUND ABDOMEN COMPLETE

[Series 1: us abdomen complete · 0.24mm/px · 14 of 78 slices shown]
[im 1/78]
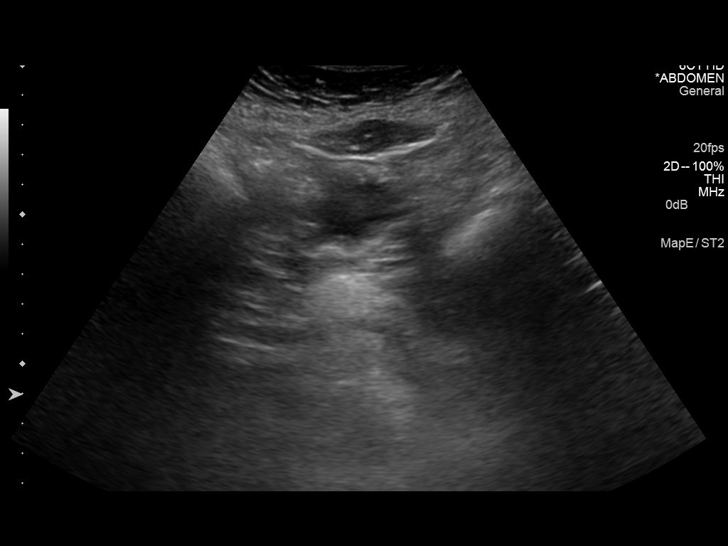
[im 7/78]
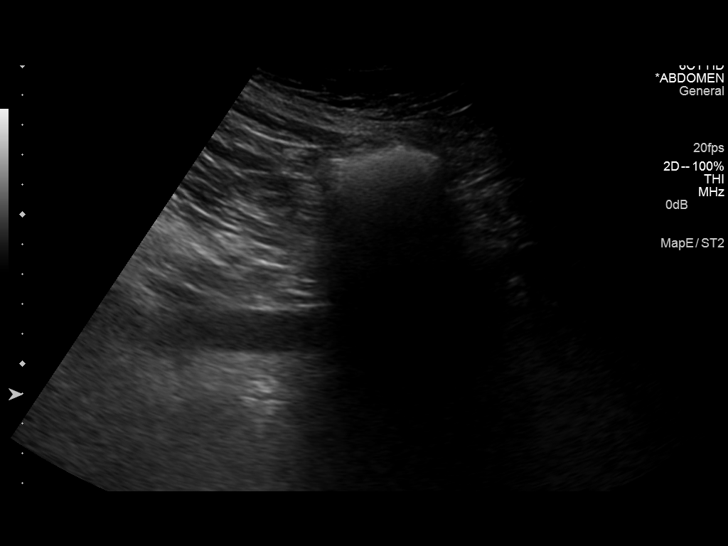
[im 13/78]
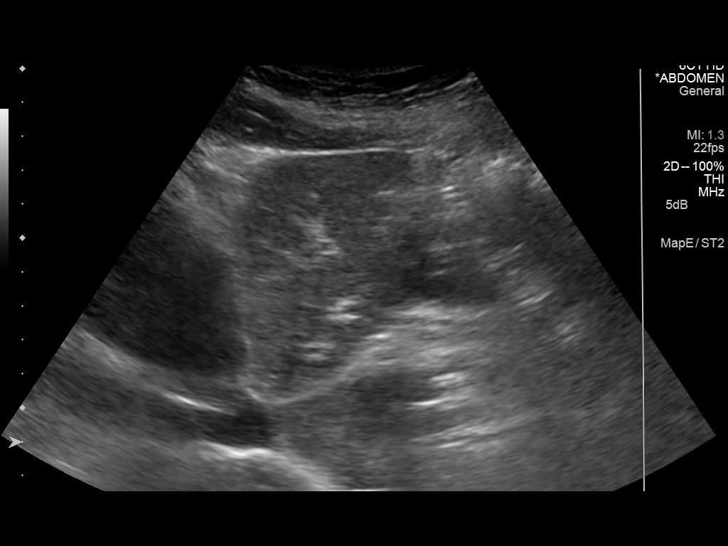
[im 20/78]
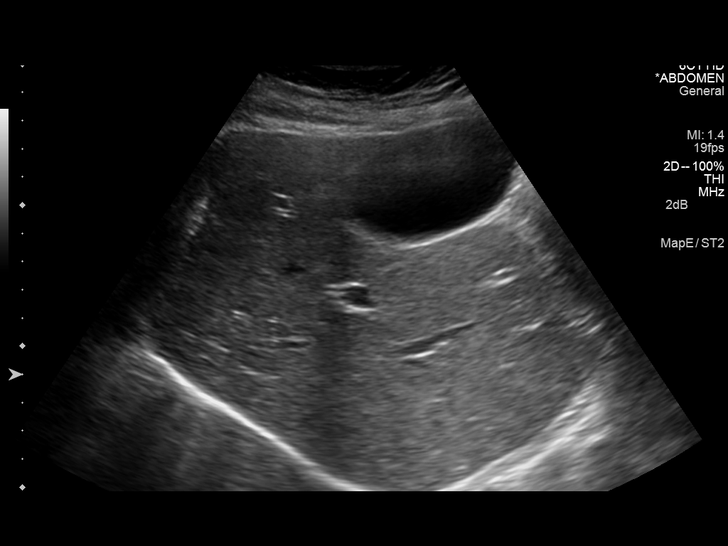
[im 26/78]
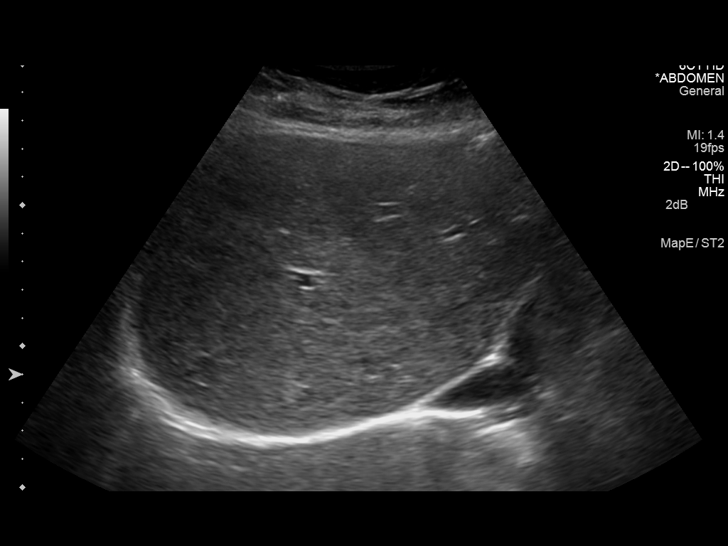
[im 29/78]
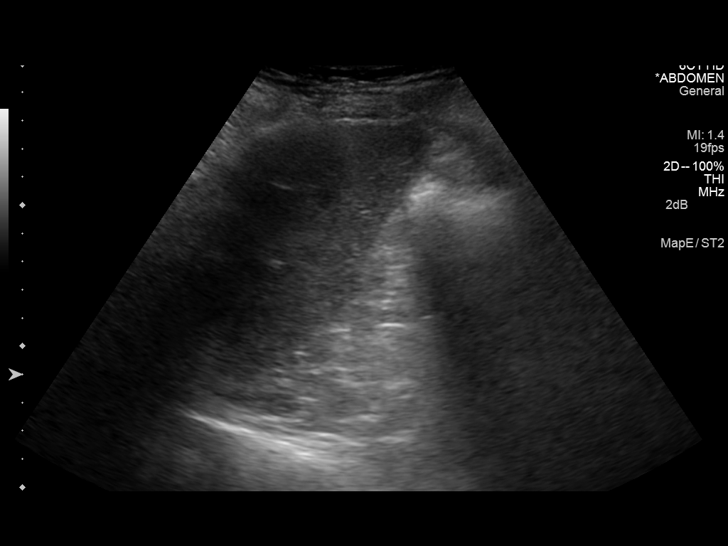
[im 36/78]
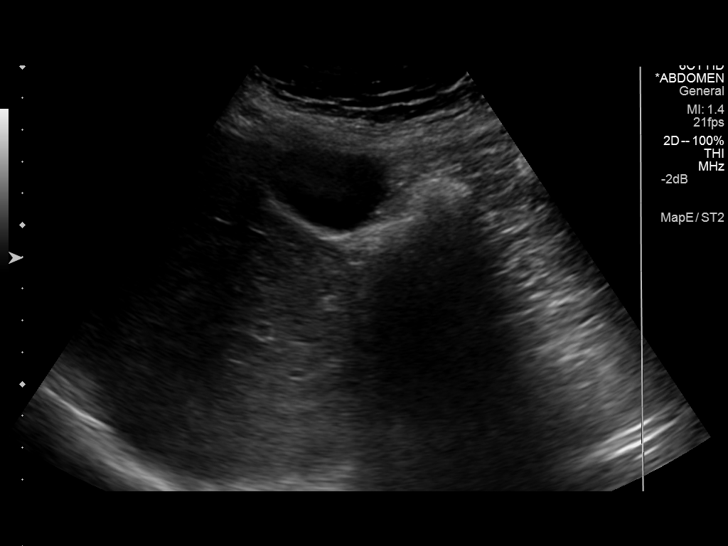
[im 42/78]
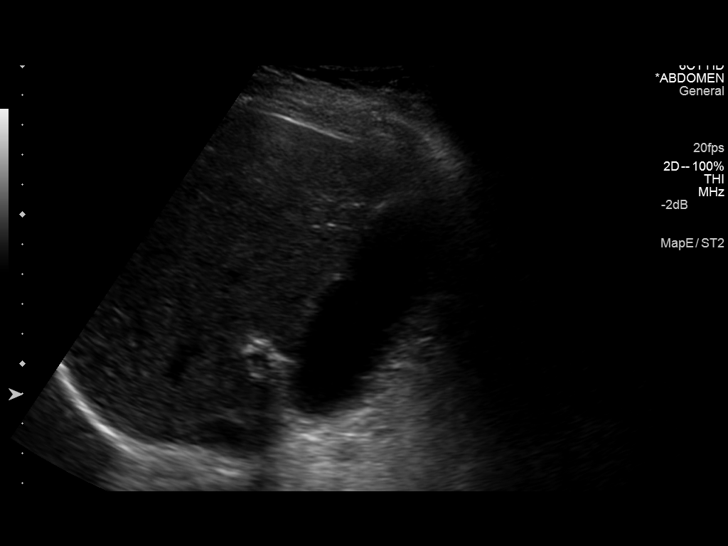
[im 49/78]
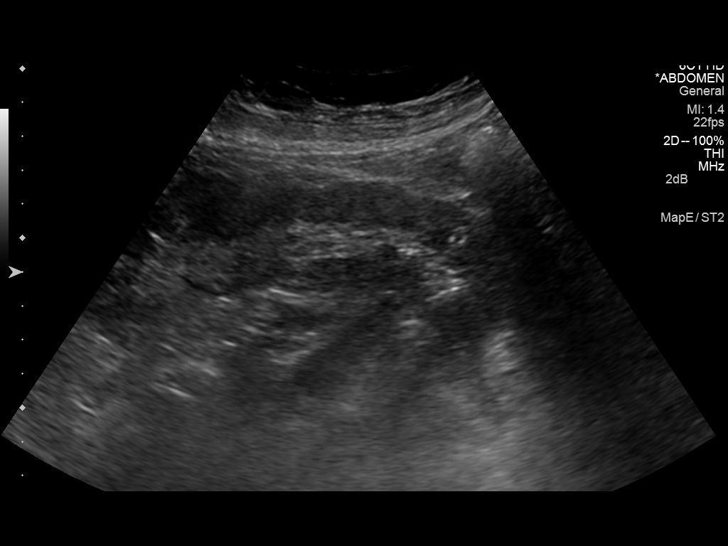
[im 52/78]
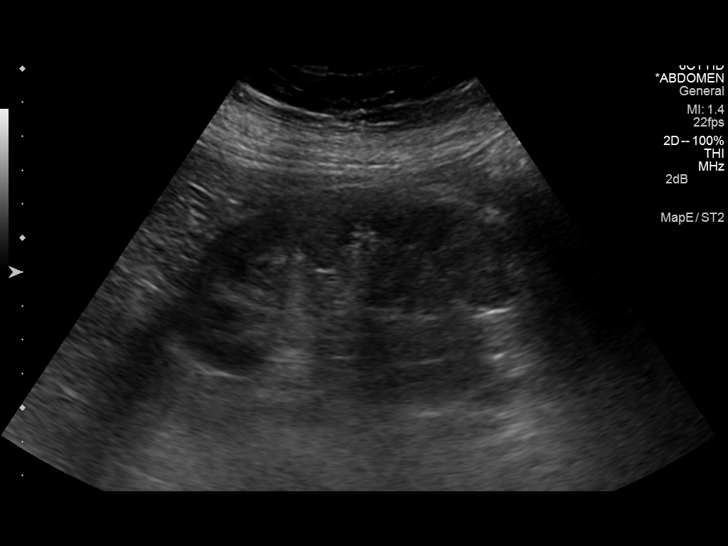
[im 58/78]
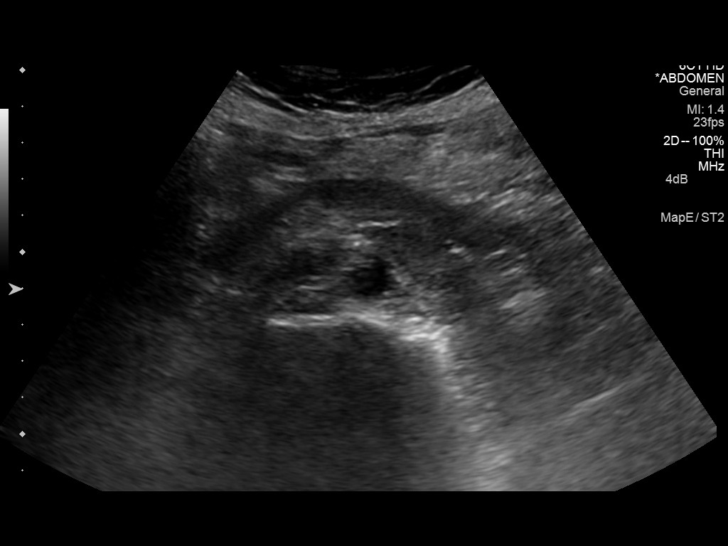
[im 65/78]
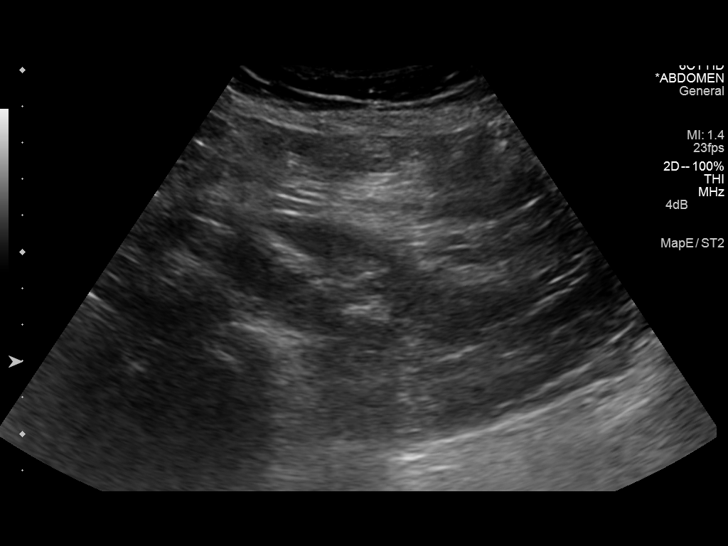
[im 71/78]
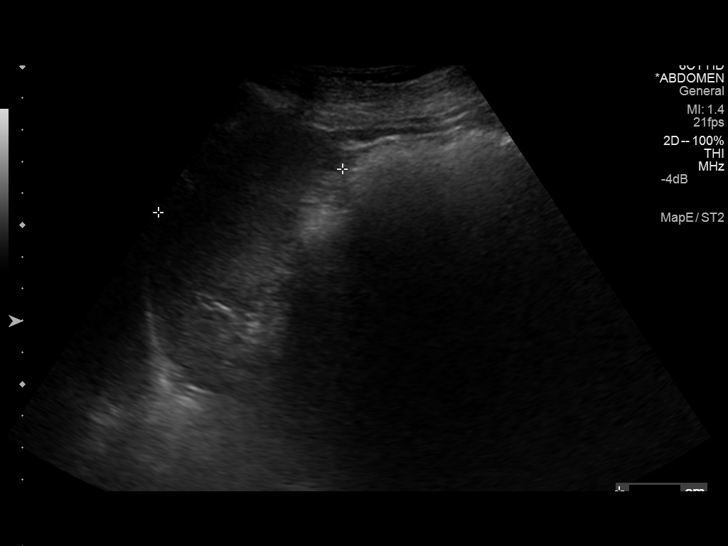
[im 78/78]
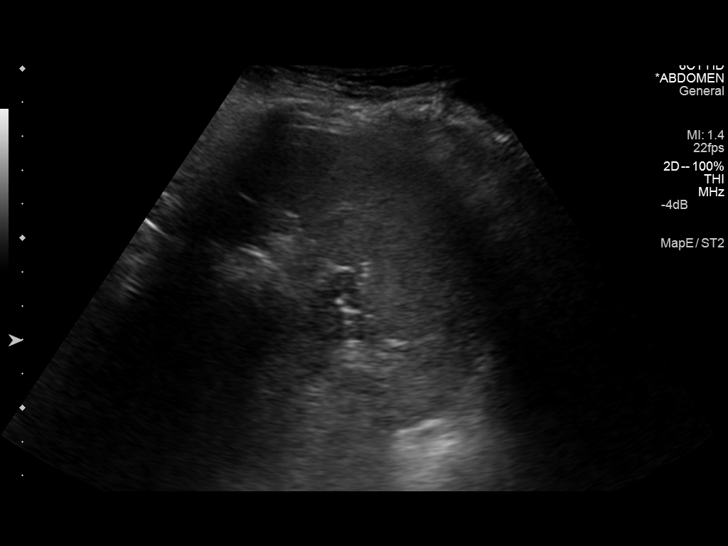

[14 of 25 positions shown; findings below may reference images not displayed]

FINDINGS: Gallbladder: No gallstones or wall thickening visualized. No
sonographic Murphy sign noted.

Common bile duct: Diameter: 5.5 mm

Liver: No focal lesion identified. Within normal limits in
parenchymal echogenicity.

IVC: No abnormality visualized.

Pancreas: Limited visualization.

Spleen: Size and appearance within normal limits.

Right Kidney: Length: 10.9 cm.. Echogenicity within normal limits.
No mass or hydronephrosis visualized. Horseshoe kidney.

Left Kidney: Length: 5.8 cm. Echogenicity within normal limits. No
mass or hydronephrosis visualized. As noted above the patient has a
horseshoe kidney.

Abdominal aorta: No aneurysm visualized.

Other findings: None.
IMPRESSION: 1. Horseshoe kidney.
2. No acute or focal abnormality.

## 2015-12-04 IMAGING — CR DG UGI W/ SMALL BOWEL HIGH DENSITY
2 series · 2 of 2 positions shown · non-contrast
Comparison: 01/26/2012

CLINICAL DATA: Ggas and bloating. Nausea and vomiting worsening for
1 year. Prior roux-en-Y bypass 10 years ago.

EXAM:
UPPER GI SERIES WITH SMALL BOWEL FOLLOW-THROUGH
FLUOROSCOPY TIME:  2 min, 40 seconds
TECHNIQUE: Combined double contrast and single contrast upper GI series using
effervescent crystals, thick barium, and thin barium. Subsequently,
serial images of the small bowel were obtained including spot views
of the terminal ileum.

[t abdomen supine (1 of 2)]
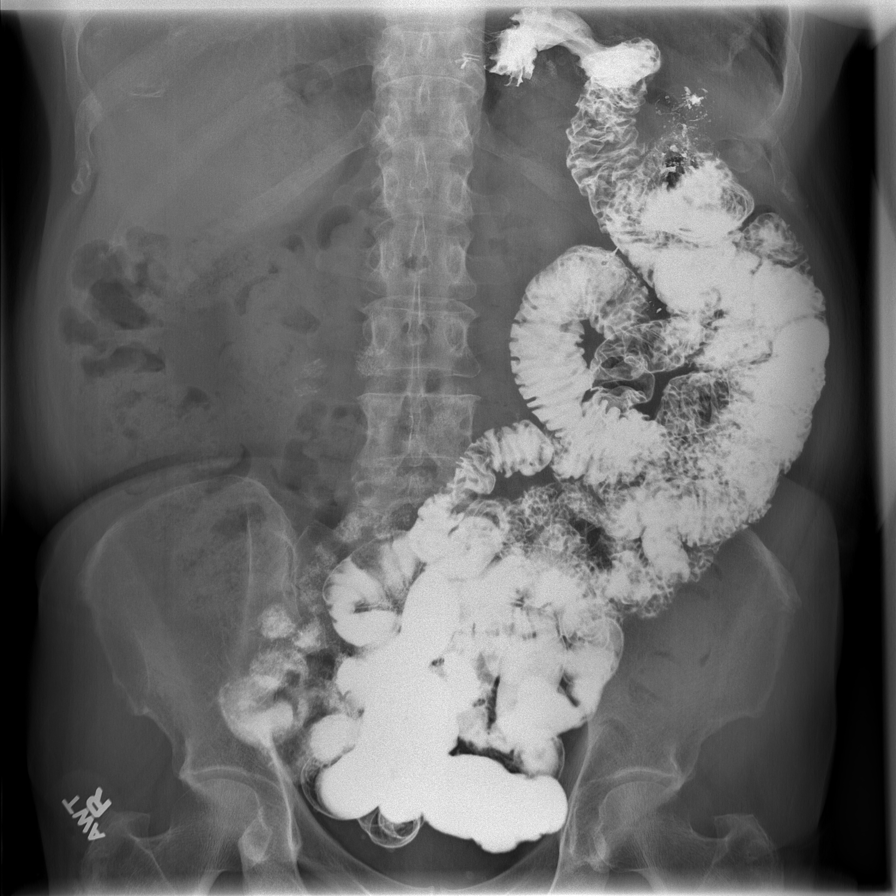

[t abdomen supine (2 of 2)]
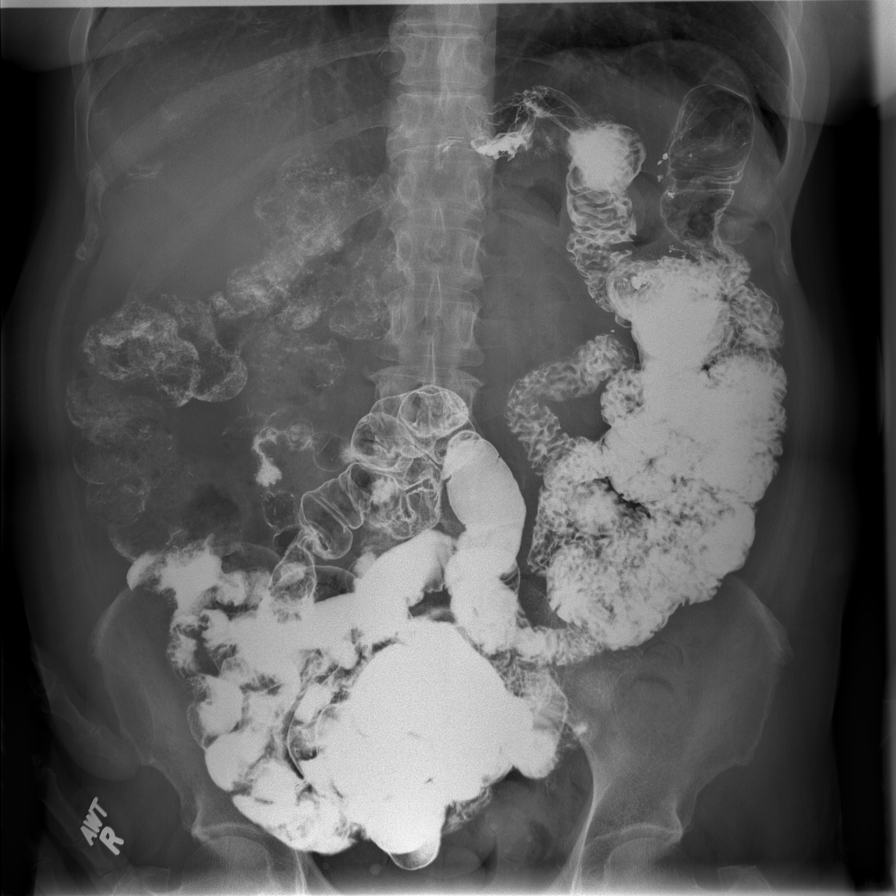

[2 of 2 positions shown; findings below may reference images not displayed]

FINDINGS: Initial KUB: Prominent stool throughout the colon favors
constipation. Left upper quadrant clips from prior surgery.

The pharyngeal phase of swallowing looks normal. Primary peristaltic
waves were preserved on [DATE] swallows.

Small capacity stomach with patent gastrojejunostomy. The gastric
pouch is almost certainly larger than when first created, but within
expected size limits for the time interval since the patient's
surgery. On series 28 there appears to be a small component of
hiatal hernia. This is a type 1 sliding hernia.

Small bowel folds in small bowel caliber normal. Transit time
through the small bowel is 30 min. Terminal ileum unremarkable.

Because the esophagus caliber looks normal, I did not give a barium
pill.
IMPRESSION: 1. The stomach is of expected capacity given the a decade since the
patient's gastric bypass, with patent gastrojejunostomy and expected
appearance.
2. Pharyngeal and esophageal phases of swallowing appear normal.
3. Small type 1 sliding hiatal hernia.
4. Unremarkable appearance of the small bowel.

## 2015-12-04 NOTE — Telephone Encounter (Signed)
Pt states Dr. Henrene Pastor offered for her to take xifaxan again last week and she told him she wanted to hold off on it. Pt calls today and states that her gas and BM's have a terrible odor and she would like to try the xifaxan again. Paperwork sent to [facility] and Dr. Henrene Pastor aware.

## 2015-12-09 ENCOUNTER — Encounter: Payer: Self-pay | Admitting: Internal Medicine

## 2016-01-01 ENCOUNTER — Other Ambulatory Visit: Payer: Self-pay | Admitting: Neurology

## 2016-01-27 ENCOUNTER — Other Ambulatory Visit: Payer: Self-pay | Admitting: Internal Medicine

## 2016-02-25 ENCOUNTER — Telehealth: Payer: Self-pay | Admitting: Internal Medicine

## 2016-02-26 NOTE — Telephone Encounter (Signed)
Yes, there may be rebound increased frequency of bowel movements as she weans off narcotics. Just monitor and give it some time. Could add fortified fiber such as Metamucil if bowels become loose

## 2016-02-26 NOTE — Telephone Encounter (Signed)
Pt states she had a spine stimulator placed in June and since then she has been cutting back on her pain meds. She is down to Fentanyl patch 12 mcg and is off of percocet. She has noticed since coming down on pain meds she is having more bowel movements. Reports yesterday she had 6 bowel movements that was formed, not diarrhea and the odor was normal and they float float. Pt states today she has not have a BM. She wanted to keep Dr. Henrene Pastor informed and wants to know if the multiple BM's could be from her coming off of the pain meds. Please advise.

## 2016-02-26 NOTE — Telephone Encounter (Signed)
Spoke with pt and she is aware.

## 2016-03-10 ENCOUNTER — Telehealth: Payer: Self-pay

## 2016-03-10 ENCOUNTER — Ambulatory Visit: Payer: Medicare Other | Admitting: Neurology

## 2016-03-10 NOTE — Telephone Encounter (Signed)
Pt called same day and cancelled her appt.

## 2016-03-12 ENCOUNTER — Encounter: Payer: Self-pay | Admitting: Neurology

## 2016-04-22 ENCOUNTER — Encounter: Payer: Self-pay | Admitting: Neurology

## 2016-04-22 ENCOUNTER — Ambulatory Visit (INDEPENDENT_AMBULATORY_CARE_PROVIDER_SITE_OTHER): Payer: Medicare Other | Admitting: Neurology

## 2016-04-22 VITALS — BP 112/65 | HR 81 | Ht 61.0 in | Wt 121.5 lb

## 2016-04-22 DIAGNOSIS — G250 Essential tremor: Secondary | ICD-10-CM

## 2016-04-22 DIAGNOSIS — G40909 Epilepsy, unspecified, not intractable, without status epilepticus: Secondary | ICD-10-CM

## 2016-04-22 NOTE — Progress Notes (Signed)
Reason for visit: Essential tremor  [name] is an 56 y.o. female  History of present illness:  [name] is a 56 year old right-handed white female with a history of an essential tremor and a history of seizures. The patient has been well controlled on carbamazepine, she takes Mysoline as well for the tremor. The tremor has been getting gradually worse over time, the patient has been referred to [facility] to see Dr. Hillery Hunter. The patient is felt to be an adequate candidate for a deep brain stimulator placement. The patient has been placed on schedule for 06/26/2016 for surgery. The patient will have bilateral stimulators placed. The patient has done well with her seizures, no recurrence has been noted for several years. MRI of the brain done recently was normal.  Past Medical History:  Diagnosis Date  . Anemia   . Anxiety   . Asthma   . Chronic back pain    spinal stenosis  . Depression   . Diabetes mellitus    diet controlled  . Diverticulosis   . Essential tremor 06/20/2014  . Fatty liver   . Hx of colonic polyps    adenomatous  . Hyperlipidemia   . Hypothyroidism   . 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 Neurogenic bladder   . Neuromuscular disorder ([place])   . Orthostatic hypotension 05/01/2015  . Pseudobulbar affect   . Seizure disorder ([place])   . Syncope, near 05/01/2015  . Tremor     Past Surgical History:  Procedure Laterality Date  . ABDOMINAL HYSTERECTOMY    . CARPAL TUNNEL RELEASE Left   . GASTRIC BYPASS  2005  . Neck Fusion  1998  . NERVE SURGERY     left ulnar nerve transposition  . PANNICULECTOMY  2010  . SPINAL CORD STIMULATOR IMPLANT    . TONSILLECTOMY    . TUBAL LIGATION      Family History  Problem Relation Age of Onset  . Heart disease Mother   . Kidney disease Mother   . Tremor Mother   . Heart attack Father 82  . Coronary artery disease Sister     x 2  . Seizures Sister   . Colon  cancer Maternal Uncle   . Colon cancer Maternal Aunt   . Diabetes    . Heart disease      paternal side  . Other Son     fatty liver  . Liver disease Neg Hx     Social history:  reports that she has never smoked. She has never used smokeless tobacco. She reports that she does not drink alcohol or use drugs.    Allergies  Allergen Reactions  . Peanut-Containing Drug Products   . Statins Other (See Comments)    Major headaches  . Sulfa Antibiotics Other (See Comments)    Yeast and thrush infections with just one dose    Medications:  Prior to Admission medications   Medication Sig Start Date End Date Taking? Authorizing Provider  albuterol (VENTOLIN HFA) 108 (90 BASE) MCG/ACT inhaler Inhale 2 puffs into the lungs every 6 (six) hours as needed.     Yes Historical Provider, MD  atorvastatin (LIPITOR) 20 MG tablet Take 20 mg by mouth 2 (two) times a week.   Yes Historical Provider, MD  baclofen (LIORESAL) 10 MG tablet Take one-half tablet by  mouth two times daily 07/02/15  Yes Kathrynn Ducking, MD  BIOTIN PO Place  10,000 mcg under the tongue daily.   Yes Historical Provider, MD  carbamazepine (CARBATROL) 300 MG 12 hr capsule Take 1 capsule (300 mg total) by mouth 2 (two) times daily. 08/20/15  Yes Kathrynn Ducking, MD  CREON 12000 units CPEP capsule ONE TABLET WITH MEALS AND SNACKS 09/27/15  Yes Irene Shipper, MD  desvenlafaxine (PRISTIQ) 100 MG 24 hr tablet Take 100 mg by mouth at bedtime.    Yes Historical Provider, MD  diclofenac sodium (VOLTAREN) 1 % GEL as needed. 07/26/15  Yes Historical Provider, MD  dicyclomine (BENTYL) 10 MG capsule Take 1 capsule (10 mg total) by mouth every morning. 11/27/15  Yes Irene Shipper, MD  diphenhydrAMINE (BENADRYL) 25 MG tablet Take 25 mg by mouth.   Yes Historical Provider, MD  DULoxetine (CYMBALTA) 30 MG capsule Take 30 mg by mouth every evening. 05/15/14  Yes Historical Provider, MD  DULoxetine (CYMBALTA) 60 MG capsule Take 60 mg by mouth daily.   Yes  Historical Provider, MD  estradiol (ESTRACE) 1 MG tablet Take 1 mg by mouth daily.  09/05/12  Yes Historical Provider, MD  fluticasone (FLONASE) 50 MCG/ACT nasal spray Place 2 sprays into both nostrils every morning.  09/13/12  Yes Historical Provider, MD  levothyroxine (SYNTHROID, LEVOTHROID) 100 MCG tablet Take 100 mcg by mouth daily. Take 1 tablet by mouth for 6 days of the week, none on Sunday   Yes Historical Provider, MD  Loperamide HCl (IMODIUM PO) Take by mouth as needed.   Yes Historical Provider, MD  loratadine (CLARITIN) 10 MG tablet Take 10 mg by mouth every morning.   Yes Historical Provider, MD  Methylcobalamin (METHYL B-12 PO) Place 5,000 mcg under the tongue daily.   Yes Historical Provider, MD  metoprolol tartrate (LOPRESSOR) 25 MG tablet Take 12.5 mg by mouth 2 (two) times daily.  07/15/15  Yes Historical Provider, MD  nitroGLYCERIN (NITROSTAT) 0.4 MG SL tablet Place 0.4 mg under the tongue every 5 (five) minutes as needed.     Yes Historical Provider, MD  oxyCODONE-acetaminophen (PERCOCET/ROXICET) 5-325 MG per tablet Take 1 tablet by mouth 4 (four) times daily as needed for severe pain.   Yes Historical Provider, MD  pantoprazole (PROTONIX) 40 MG tablet TAKE 1 TABLET BY MOUTH  DAILY 01/27/16  Yes Irene Shipper, MD  primidone (MYSOLINE) 250 MG tablet TAKE 0.5 TABLETS (125 MG TOTAL) BY MOUTH 2 (TWO) TIMES DAILY. 01/01/16  Yes Kathrynn Ducking, MD  Saccharomyces boulardii (FLORASTOR PO) Take 2 capsules by mouth 2 (two) times daily.   Yes Historical Provider, MD  valACYclovir (VALTREX) 500 MG tablet Take by mouth.   Yes Historical Provider, MD    ROS:  Out of a complete 14 system review of symptoms, the patient complains only of the following symptoms, and all other reviewed systems are negative.  Tremors  Blood pressure 112/65, pulse 81, height 5\' 1"  (1.549 m), weight 121 lb 8 oz (55.1 kg).  Physical Exam  General: The patient is alert and cooperative at the time of the  examination.  Skin: No significant peripheral edema is noted.   Neurologic Exam  Mental status: The patient is alert and oriented x 3 at the time of the examination. The patient has apparent normal recent and remote memory, with an apparently normal attention span and concentration ability.   Cranial nerves: Facial symmetry is present. Speech is normal, no aphasia or dysarthria is noted. Extraocular movements are full. Visual fields are full. A slight head and  neck tremor is noted.  Motor: The patient has good strength in all 4 extremities.  Sensory examination: Soft touch sensation is symmetric on the face, arms, and legs.  Coordination: The patient has good finger-nose-finger and heel-to-shin bilaterally. Prominent tremors are noted with finger-nose-finger bilaterally.  Gait and station: The patient has a normal gait. Tandem gait is normal. Romberg is negative. No drift is seen.  Reflexes: Deep tendon reflexes are symmetric.   Assessment/Plan:  1. History of seizures  2. Essential tremor  The patient will have a deep brain stimulator placed in March 2018. She will follow-up through this office in 6 months. We will check blood work at that time. Hopefully, the patient may be able to come off of primidone in the future.  Jill Alexanders MD 04/22/2016 10:55 AM  [facility] [address] [place], [place] [address]  Phone [phone] Fax [phone]

## 2016-04-27 ENCOUNTER — Other Ambulatory Visit: Payer: Self-pay | Admitting: Neurology

## 2016-05-04 ENCOUNTER — Other Ambulatory Visit: Payer: Self-pay | Admitting: Internal Medicine

## 2016-05-12 ENCOUNTER — Telehealth: Payer: Self-pay | Admitting: Internal Medicine

## 2016-05-12 NOTE — Telephone Encounter (Signed)
Pt states some days she is having diarrhea and the days she isn't having diarrhea she is going 4-5 times a day. States she cannot take Imodium because it totally constipates her. Pt states the only thing that seems to help some is Levbid. Pt wants to know if there is anything else available that she may be able to try. Please advise.

## 2016-05-12 NOTE — Telephone Encounter (Signed)
Spoke with pt and she is aware.

## 2016-05-12 NOTE — Telephone Encounter (Signed)
Try a fiber supplement like Metamucil 2 tablespoons daily to give the stool better form

## 2016-05-19 ENCOUNTER — Telehealth: Payer: Self-pay | Admitting: Internal Medicine

## 2016-05-20 MED ORDER — PANCRELIPASE (LIP-PROT-AMYL) 12000-38000 UNITS PO CPEP: ORAL_CAPSULE | ORAL | 2 refills | Status: DC

## 2016-05-20 NOTE — Telephone Encounter (Signed)
Refilled patient's creon.  Patient stated she has not had as many bm's today.  She is going to see how she does and call back if necessary.

## 2016-06-15 ENCOUNTER — Other Ambulatory Visit: Payer: Self-pay | Admitting: Neurology

## 2016-06-15 ENCOUNTER — Other Ambulatory Visit: Payer: Self-pay | Admitting: Internal Medicine

## 2016-07-14 ENCOUNTER — Other Ambulatory Visit: Payer: Self-pay | Admitting: Neurology

## 2016-07-14 NOTE — Telephone Encounter (Signed)
Refilled for yr.  Baclofen 5mg  po bid.  [facility].  Has appt 10/2016 with Dr. Jannifer Franklin.

## 2016-08-05 ENCOUNTER — Other Ambulatory Visit: Payer: Self-pay | Admitting: Internal Medicine

## 2016-08-05 ENCOUNTER — Other Ambulatory Visit: Payer: Self-pay | Admitting: Neurology

## 2016-10-22 ENCOUNTER — Encounter: Payer: Self-pay | Admitting: Neurology

## 2016-10-22 ENCOUNTER — Ambulatory Visit (INDEPENDENT_AMBULATORY_CARE_PROVIDER_SITE_OTHER): Payer: Medicare Other | Admitting: Neurology

## 2016-10-22 VITALS — BP 95/56 | HR 67 | Ht 61.0 in | Wt 137.0 lb

## 2016-10-22 DIAGNOSIS — G250 Essential tremor: Secondary | ICD-10-CM | POA: Diagnosis not present

## 2016-10-22 MED ORDER — CARBAMAZEPINE ER 300 MG PO CP12: ORAL_CAPSULE | ORAL | 3 refills | Status: DC

## 2016-10-22 NOTE — Progress Notes (Signed)
Reason for visit: Seizures, tremor  [name] is an 56 y.o. female  History of present illness:  [name] is a 56 year old right-handed white female with a history of an essential tremor. The patient has gone to [facility] and has undergone bilateral deep brain stimulator placements with excellent results. The patient has had some residual tremor on the right hand, but the left hand is essentially tremor free. The patient has tolerated the procedure well, she is happy with the results. She is back to playing guitar and singing and painting. She believes that this procedure has significantly improved her quality of life. The patient has a history of seizures, she has been well controlled on carbamazepine, she has come off of the Mysoline. The patient returns for an evaluation.  Past Medical History:  Diagnosis Date  . Anemia   . Anxiety   . Asthma   . Chronic back pain    spinal stenosis  . Depression   . Diabetes mellitus    diet controlled  . Diverticulosis   . Essential tremor 06/20/2014  . Fatty liver   . Hx of colonic polyps    adenomatous  . Hyperlipidemia   . Hypothyroidism   . 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 Neurogenic bladder   . Neuromuscular disorder ([place])   . Orthostatic hypotension 05/01/2015  . Pseudobulbar affect   . Seizure disorder ([place])   . Syncope, near 05/01/2015  . Tremor     Past Surgical History:  Procedure Laterality Date  . ABDOMINAL HYSTERECTOMY    . CARPAL TUNNEL RELEASE Left   . GASTRIC BYPASS  2005  . Neck Fusion  1998  . NERVE SURGERY     left ulnar nerve transposition  . PANNICULECTOMY  2010  . SPINAL CORD STIMULATOR IMPLANT    . TONSILLECTOMY    . TUBAL LIGATION      Family History  Problem Relation Age of Onset  . Heart disease Mother   . Kidney disease Mother   . Tremor Mother   . Heart attack Father 57  . Coronary artery disease Sister        x 2  . Seizures Sister    . Colon cancer Maternal Uncle   . Colon cancer Maternal Aunt   . Diabetes Unknown   . Heart disease Unknown        paternal side  . Other Son        fatty liver  . Liver disease Neg Hx     Social history:  reports that she has never smoked. She has never used smokeless tobacco. She reports that she does not drink alcohol or use drugs.    Allergies  Allergen Reactions  . Peanut-Containing Drug Products   . Statins Other (See Comments)    Major headaches  . Sulfa Antibiotics Other (See Comments)    Yeast and thrush infections with just one dose    Medications:  Prior to Admission medications   Medication Sig Start Date End Date Taking? Authorizing Provider  albuterol (VENTOLIN HFA) 108 (90 BASE) MCG/ACT inhaler Inhale 2 puffs into the lungs every 6 (six) hours as needed.     Yes [provider]  atorvastatin (LIPITOR) 20 MG tablet Take 20 mg by mouth 2 (two) times a week.   Yes [provider]  baclofen (LIORESAL) 10 MG tablet TAKE ONE-HALF TABLET BY  MOUTH TWO TIMES DAILY 07/14/16  Yes Kathrynn Ducking, MD  BIOTIN PO Place 10,000 mcg under the tongue daily.   Yes [provider]  carbamazepine (CARBATROL) 300 MG 12 hr capsule TAKE 1 CAPSULE BY MOUTH TWO TIMES DAILY 08/06/16  Yes Kathrynn Ducking, MD  desvenlafaxine (PRISTIQ) 100 MG 24 hr tablet Take 100 mg by mouth at bedtime.    Yes [provider]  diclofenac sodium (VOLTAREN) 1 % GEL as needed. 07/26/15  Yes [provider]  dicyclomine (BENTYL) 10 MG capsule TAKE 1 CAPSULE BY MOUTH  EVERY MORNING 08/07/16  Yes Irene Shipper, MD  diphenhydrAMINE (BENADRYL) 25 MG tablet Take 25 mg by mouth.   Yes [provider]  DULoxetine (CYMBALTA) 30 MG capsule Take 30 mg by mouth every evening. 05/15/14  Yes [provider]  DULoxetine (CYMBALTA) 60 MG capsule Take 60 mg by mouth daily.   Yes [provider]  estradiol (ESTRACE) 1 MG tablet Take 1 mg by mouth daily.   09/05/12  Yes [provider]  fluticasone (FLONASE) 50 MCG/ACT nasal spray Place 2 sprays into both nostrils every morning.  09/13/12  Yes [provider]  levothyroxine (SYNTHROID, LEVOTHROID) 100 MCG tablet Take 100 mcg by mouth daily. Take 1 tablet by mouth for 6 days of the week, none on Sunday   Yes [provider]  lipase/protease/amylase (CREON) 12000 units CPEP capsule ONE TABLET WITH MEALS AND SNACKS 05/20/16  Yes Irene Shipper, MD  Loperamide HCl (IMODIUM PO) Take by mouth as needed.   Yes [provider]  loratadine (CLARITIN) 10 MG tablet Take 10 mg by mouth every morning.   Yes [provider]  Methylcobalamin (METHYL B-12 PO) Place 5,000 mcg under the tongue daily.   Yes [provider]  metoprolol tartrate (LOPRESSOR) 25 MG tablet Take 12.5 mg by mouth 2 (two) times daily.  07/15/15  Yes [provider]  nitroGLYCERIN (NITROSTAT) 0.4 MG SL tablet Place 0.4 mg under the tongue every 5 (five) minutes as needed.     Yes [provider]  oxyCODONE-acetaminophen (PERCOCET/ROXICET) 5-325 MG per tablet Take 1 tablet by mouth 4 (four) times daily as needed for severe pain.   Yes [provider]  pantoprazole (PROTONIX) 40 MG tablet TAKE 1 TABLET BY MOUTH  DAILY 06/15/16  Yes Irene Shipper, MD  Saccharomyces boulardii (FLORASTOR PO) Take 2 capsules by mouth 2 (two) times daily.   Yes [provider]  valACYclovir (VALTREX) 500 MG tablet Take by mouth.   Yes [provider]    ROS:  Out of a complete 14 system review of symptoms, the patient complains only of the following symptoms, and all other reviewed systems are negative.  Tremor  Blood pressure (!) 95/56, pulse 67, height 5\' 1"  (1.549 m), weight 137 lb (62.1 kg).  Physical Exam  General: The patient is alert and cooperative at the time of the examination.  Skin: No significant peripheral edema is noted.   Neurologic Exam  Mental  status: The patient is alert and oriented x 3 at the time of the examination. The patient has apparent normal recent and remote memory, with an apparently normal attention span and concentration ability.   Cranial nerves: Facial symmetry is present. Speech is normal, no aphasia or dysarthria is noted. Extraocular movements are full. Visual fields are full.  Motor: The patient has good strength in all 4 extremities.  Sensory examination: Soft touch sensation is symmetric on the face, arms, and legs.  Coordination: The patient has good finger-nose-finger and heel-to-shin bilaterally. The patient does have some tremor translated handwriting with the right hand. An intention type tremor seen with finger-nose-finger on the right, not on the left.  Gait and station: The patient has a normal gait. Tandem gait is normal. Romberg is negative. No drift is seen.  Reflexes: Deep tendon reflexes are symmetric.   Assessment/Plan:  1. Essential tremor  2. Status post deep brain stimulator placement, bilateral  3. History of seizures  The patient will be given a prescription for carbamazepine, she will follow-up in one year. Dr. Hillery Hunter at [facility] will be following her for her essential tremors.  Jill Alexanders MD 10/22/2016 3:20 PM  [facility] [address] [place], [place] [address]  Phone [phone] Fax [phone]

## 2016-11-17 ENCOUNTER — Other Ambulatory Visit: Payer: Self-pay | Admitting: Internal Medicine

## 2017-02-25 ENCOUNTER — Telehealth: Payer: Self-pay | Admitting: Neurology

## 2017-02-25 NOTE — Telephone Encounter (Signed)
Blood work done through the primary care doctor, Dr. Forde Dandy revealed a carbamazepine level 7.1.

## 2017-03-09 ENCOUNTER — Other Ambulatory Visit: Payer: Self-pay | Admitting: Internal Medicine

## 2017-04-09 ENCOUNTER — Other Ambulatory Visit: Payer: Self-pay | Admitting: Internal Medicine

## 2017-05-09 ENCOUNTER — Other Ambulatory Visit: Payer: Self-pay | Admitting: Internal Medicine

## 2017-05-11 ENCOUNTER — Other Ambulatory Visit: Payer: Self-pay

## 2017-05-11 ENCOUNTER — Encounter: Payer: Self-pay | Admitting: Neurology

## 2017-05-11 MED ORDER — PANCRELIPASE (LIP-PROT-AMYL) 12000-38000 UNITS PO CPEP: ORAL_CAPSULE | ORAL | 2 refills | Status: DC

## 2017-06-03 ENCOUNTER — Telehealth: Payer: Self-pay | Admitting: Internal Medicine

## 2017-06-08 ENCOUNTER — Other Ambulatory Visit: Payer: Self-pay

## 2017-06-08 MED ORDER — PANCRELIPASE (LIP-PROT-AMYL) 12000-38000 UNITS PO CPEP: ORAL_CAPSULE | ORAL | 3 refills | Status: DC

## 2017-06-08 NOTE — Telephone Encounter (Signed)
Sent new rx clarifying one with each meal and one snack - 4 total

## 2017-06-16 ENCOUNTER — Other Ambulatory Visit: Payer: Self-pay | Admitting: Internal Medicine

## 2017-08-24 ENCOUNTER — Other Ambulatory Visit: Payer: Self-pay | Admitting: Internal Medicine

## 2017-08-30 ENCOUNTER — Other Ambulatory Visit: Payer: Self-pay | Admitting: Internal Medicine

## 2017-08-30 ENCOUNTER — Other Ambulatory Visit: Payer: Self-pay | Admitting: Neurology

## 2017-09-09 ENCOUNTER — Other Ambulatory Visit: Payer: Self-pay | Admitting: Neurology

## 2017-10-18 ENCOUNTER — Other Ambulatory Visit: Payer: Self-pay | Admitting: Internal Medicine

## 2017-10-18 ENCOUNTER — Other Ambulatory Visit: Payer: Self-pay | Admitting: Neurology

## 2017-10-25 ENCOUNTER — Ambulatory Visit: Payer: Medicare Other | Admitting: Neurology

## 2017-10-28 ENCOUNTER — Other Ambulatory Visit: Payer: Self-pay | Admitting: Internal Medicine

## 2017-11-27 ENCOUNTER — Other Ambulatory Visit: Payer: Self-pay | Admitting: Neurology

## 2017-11-27 ENCOUNTER — Other Ambulatory Visit: Payer: Self-pay | Admitting: Internal Medicine

## 2017-12-02 ENCOUNTER — Other Ambulatory Visit: Payer: Self-pay

## 2017-12-02 ENCOUNTER — Ambulatory Visit: Payer: Medicare Other | Admitting: Neurology

## 2017-12-02 ENCOUNTER — Encounter: Payer: Self-pay | Admitting: Neurology

## 2017-12-02 VITALS — BP 118/65 | HR 73 | Ht 61.0 in | Wt 150.0 lb

## 2017-12-02 DIAGNOSIS — G250 Essential tremor: Secondary | ICD-10-CM

## 2017-12-02 DIAGNOSIS — G4762 Sleep related leg cramps: Secondary | ICD-10-CM | POA: Diagnosis not present

## 2017-12-02 DIAGNOSIS — G40909 Epilepsy, unspecified, not intractable, without status epilepticus: Secondary | ICD-10-CM | POA: Diagnosis not present

## 2017-12-02 HISTORY — DX: Sleep related leg cramps: G47.62

## 2017-12-02 NOTE — Progress Notes (Signed)
Reason for visit: Seizures  [name] is an 57 y.o. female  History of present illness:  [name] is a 57 year old right-handed white female with a history of an essential tremor, status post a deep brain stimulater placement.  She is followed through [facility] for this issue, she recently had a stimulator adjustment on 28 September 2017.  She is quite pleased with the results of the stimulator placements.  She has had some weight gain following the stimulator placement and some difficulty with insomnia.  She has not had any further seizures, she remains on Tegretol.  She tolerates the medication well.  She gets annual blood work to include a Tegretol level through Dr. Forde Dandy in October of each year.  The patient returns for an evaluation.  The carbamazepine level last year was 7.1.  Past Medical History:  Diagnosis Date  . Anemia   . Anxiety   . Asthma   . Chronic back pain    spinal stenosis  . Depression   . Diabetes mellitus    diet controlled  . Diverticulosis   . Essential tremor 06/20/2014  . Fatty liver   . Hx of colonic polyps    adenomatous  . Hyperlipidemia   . Hypothyroidism   . 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 Neurogenic bladder   . Neuromuscular disorder ([place])   . Orthostatic hypotension 05/01/2015  . Pseudobulbar affect   . Seizure disorder ([place])   . Syncope, near 05/01/2015  . Tremor     Past Surgical History:  Procedure Laterality Date  . ABDOMINAL HYSTERECTOMY    . CARPAL TUNNEL RELEASE Left   . GASTRIC BYPASS  2005  . Neck Fusion  1998  . NERVE SURGERY     left ulnar nerve transposition  . PANNICULECTOMY  2010  . SPINAL CORD STIMULATOR IMPLANT    . TONSILLECTOMY    . TUBAL LIGATION      Family History  Problem Relation Age of Onset  . Heart disease Mother   . Kidney disease Mother   . Tremor Mother   . Heart attack Father 33  . Coronary artery disease Sister        x 2  . Seizures  Sister   . Colon cancer Maternal Uncle   . Colon cancer Maternal Aunt   . Diabetes Unknown   . Heart disease Unknown        paternal side  . Other Son        fatty liver  . Liver disease Neg Hx     Social history:  reports that she has never smoked. She has never used smokeless tobacco. She reports that she does not drink alcohol or use drugs.    Allergies  Allergen Reactions  . Peanut-Containing Drug Products   . Statins Other (See Comments)    Major headaches  . Sulfa Antibiotics Other (See Comments)    Yeast and thrush infections with just one dose    Medications:  Prior to Admission medications   Medication Sig Start Date End Date Taking? Authorizing Provider  albuterol (VENTOLIN HFA) 108 (90 BASE) MCG/ACT inhaler Inhale 2 puffs into the lungs every 6 (six) hours as needed.     Yes [provider]  baclofen (LIORESAL) 10 MG tablet TAKE ONE-HALF TABLET BY  MOUTH TWO TIMES DAILY 07/14/16  Yes Kathrynn Ducking, MD  BIOTIN PO Place 10,000 mcg under the tongue daily.  Yes [provider]  carbamazepine (CARBATROL) 300 MG 12 hr capsule TAKE 1 CAPSULE BY MOUTH TWO TIMES DAILY 10/19/17  Yes Kathrynn Ducking, MD  desvenlafaxine (PRISTIQ) 100 MG 24 hr tablet Take 100 mg by mouth at bedtime.    Yes [provider]  diclofenac sodium (VOLTAREN) 1 % GEL as needed. 07/26/15  Yes [provider]  dicyclomine (BENTYL) 10 MG capsule TAKE 1 CAPSULE BY MOUTH  EVERY MORNING 08/30/17  Yes Irene Shipper, MD  diphenhydrAMINE (BENADRYL) 25 MG tablet Take 25 mg by mouth.   Yes [provider]  DULoxetine (CYMBALTA) 30 MG capsule Take 30 mg by mouth every evening. 05/15/14  Yes [provider]  DULoxetine (CYMBALTA) 60 MG capsule Take 60 mg by mouth daily.   Yes [provider]  estradiol (ESTRACE) 1 MG tablet Take 1 mg by mouth daily.  09/05/12  Yes [provider]  ezetimibe (ZETIA) 10 MG tablet Take 10 mg by mouth daily.   Yes  [provider]  fluticasone (FLONASE) 50 MCG/ACT nasal spray Place 2 sprays into both nostrils every morning.  09/13/12  Yes [provider]  levothyroxine (SYNTHROID, LEVOTHROID) 100 MCG tablet Take 100 mcg by mouth daily.    Yes [provider]  lipase/protease/amylase (CREON) 12000 units CPEP capsule ONE TABLET WITH MEALS AND ONE SNACK - TOTAL OF 4 06/16/17  Yes Irene Shipper, MD  Loperamide HCl (IMODIUM PO) Take by mouth as needed.   Yes [provider]  loratadine (CLARITIN) 10 MG tablet Take 10 mg by mouth every morning.   Yes [provider]  MELATONIN PO Take 1 Dose by mouth at bedtime.   Yes [provider]  Methylcobalamin (METHYL B-12 PO) Place 5,000 mcg under the tongue daily.   Yes [provider]  metoprolol tartrate (LOPRESSOR) 25 MG tablet Take 12.5 mg by mouth 2 (two) times daily.  07/15/15  Yes [provider]  nitroGLYCERIN (NITROSTAT) 0.4 MG SL tablet Place 0.4 mg under the tongue every 5 (five) minutes as needed.     Yes [provider]  oxyCODONE-acetaminophen (PERCOCET/ROXICET) 5-325 MG per tablet Take 1 tablet by mouth 4 (four) times daily as needed for severe pain.   Yes [provider]  pantoprazole (PROTONIX) 40 MG tablet TAKE 1 TABLET BY MOUTH  DAILY 08/24/17  Yes Irene Shipper, MD  valACYclovir (VALTREX) 500 MG tablet Take by mouth.   Yes [provider]  zolpidem (AMBIEN) 10 MG tablet Take 10 mg by mouth at bedtime as needed for sleep.   Yes [provider]    ROS:  Out of a complete 14 system review of symptoms, the patient complains only of the following symptoms, and all other reviewed systems are negative.  Ringing in the ears, difficulty swallowing Cough, wheezing Incontinence of the bowels Insomnia Environmental allergies Difficulty urinating Bruising easily, anemia  Blood pressure 118/65, pulse 73, height 5\' 1"  (1.549 m), weight 150 lb (68 kg), SpO2 98  %.  Physical Exam  General: The patient is alert and cooperative at the time of the examination.  Patient is mildly obese.  Skin: No significant peripheral edema is noted.   Neurologic Exam  Mental status: The patient is alert and oriented x 3 at the time of the examination. The patient has apparent normal recent and remote memory, with an apparently normal attention span and concentration ability.   Cranial nerves: Facial symmetry is present. Speech is normal, no aphasia  or dysarthria is noted. Extraocular movements are full. Visual fields are full.  Motor: The patient has good strength in all 4 extremities.  Sensory examination: Soft touch sensation is symmetric on the face, arms, and legs.  Coordination: The patient has good finger-nose-finger and heel-to-shin bilaterally.  Very minimal tremor seen with finger-nose-finger on the right.  Gait and station: The patient has a normal gait. Tandem gait is normal. Romberg is negative. No drift is seen.  Reflexes: Deep tendon reflexes are symmetric.   Assessment/Plan:  1.  Benign essential tremor, deep brain stim later placement  2.  History of seizures, well controlled  3.  Nocturnal leg cramps  The patient will continue her carbamazepine for the seizures, she is doing well with this.  She takes baclofen for nocturnal leg cramps, this remains effective for this.  She will follow-up through this office in 1 year.   Greater than 50% of the visit was spent in counseling and coordination of care.  Face-to-face time with the patient was 20 minutes.   Jill Alexanders MD 12/02/2017 12:23 PM  [facility] [address] [place], [place] [address]  Phone [phone] Fax [phone]

## 2017-12-06 ENCOUNTER — Other Ambulatory Visit: Payer: Self-pay | Admitting: Neurology

## 2017-12-17 ENCOUNTER — Other Ambulatory Visit: Payer: Self-pay

## 2017-12-20 ENCOUNTER — Other Ambulatory Visit: Payer: Self-pay | Admitting: Internal Medicine

## 2017-12-30 ENCOUNTER — Other Ambulatory Visit: Payer: Self-pay | Admitting: Neurology

## 2018-01-01 ENCOUNTER — Other Ambulatory Visit: Payer: Self-pay | Admitting: Neurology

## 2018-01-01 ENCOUNTER — Other Ambulatory Visit: Payer: Self-pay | Admitting: Internal Medicine

## 2018-03-14 ENCOUNTER — Telehealth: Payer: Self-pay | Admitting: Internal Medicine

## 2018-03-14 MED ORDER — DICYCLOMINE HCL 10 MG PO CAPS: 10.0000 mg | ORAL_CAPSULE | Freq: Every morning | ORAL | 0 refills | Status: DC

## 2018-03-14 MED ORDER — PANTOPRAZOLE SODIUM 40 MG PO TBEC: 40.0000 mg | DELAYED_RELEASE_TABLET | Freq: Every day | ORAL | 0 refills | Status: DC

## 2018-03-14 NOTE — Telephone Encounter (Signed)
Refilled rx's but patient must keep office visit for ANY further refills

## 2018-04-19 ENCOUNTER — Ambulatory Visit: Payer: Medicare Other | Admitting: Internal Medicine

## 2018-05-04 ENCOUNTER — Ambulatory Visit: Payer: Medicare Other | Admitting: Internal Medicine

## 2018-05-24 ENCOUNTER — Encounter: Payer: Self-pay | Admitting: Internal Medicine

## 2018-05-24 ENCOUNTER — Ambulatory Visit: Payer: Medicare Other | Admitting: Internal Medicine

## 2018-05-24 VITALS — BP 122/68 | HR 64 | Ht 61.0 in | Wt 154.0 lb

## 2018-05-24 DIAGNOSIS — R197 Diarrhea, unspecified: Secondary | ICD-10-CM | POA: Diagnosis not present

## 2018-05-24 DIAGNOSIS — R159 Full incontinence of feces: Secondary | ICD-10-CM | POA: Diagnosis not present

## 2018-05-24 DIAGNOSIS — K219 Gastro-esophageal reflux disease without esophagitis: Secondary | ICD-10-CM | POA: Diagnosis not present

## 2018-05-24 DIAGNOSIS — Z86010 Personal history of colonic polyps: Secondary | ICD-10-CM | POA: Diagnosis not present

## 2018-05-24 MED ORDER — DICYCLOMINE HCL 10 MG PO CAPS: 10.0000 mg | ORAL_CAPSULE | Freq: Every morning | ORAL | 1 refills | Status: DC

## 2018-05-24 MED ORDER — PANTOPRAZOLE SODIUM 40 MG PO TBEC: 40.0000 mg | DELAYED_RELEASE_TABLET | Freq: Every day | ORAL | 3 refills | Status: DC

## 2018-05-24 NOTE — Patient Instructions (Signed)
We have sent the following medications to your pharmacy for you to pick up at your convenience: dicyclomine, Protonix  You may take two tablespoons of Metamucil daily

## 2018-05-24 NOTE — Progress Notes (Signed)
HISTORY OF PRESENT ILLNESS:  TEREASA Bowman is a 58 y.o. female with diabetes mellitus, seizure disorder, asthma, chronic back pain, prior Roux-en-Y gastric bypass surgery approximately 13 years ago.  She was last evaluated in this office November 27, 2015.  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  She subsequently underwent complete colonoscopy December 02, 2015.  In addition to sigmoid diverticulosis, tubular adenomas and sessile serrated polyps were identified and removed.  Follow-up in 3 years recommended.  Patient has had significant interval change, for the better, in her medical status.  First, she underwent back stimulator placement and has been able to get off of her chronic narcotics.  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  She is also been able to gain weight.  She appears happier and healthier.  She presents today regarding follow-up of her chronic GI conditions and requesting medication refill.  First, for classic reflux symptoms she takes pantoprazole 40 mg daily.  On medication she does well.  No active upper GI complaints at this time.  Next, she does take Bentyl for abdominal cramping periodically.  She reports a tendency toward chronic loose stools for which he takes Imodium several times weekly.  If her bowels are too loose she is prone to having fecal incontinence.  For her cramping she typically takes Bentyl 10 mg in the morning, but no more frequent than that frequency.  GI review of systems is otherwise negative.  Upper endoscopy in 2012 revealed postoperative anatomy status post Roux-en-Y gastric bypass.  REVIEW OF SYSTEMS:  All non-GI ROS negative unless otherwise stated in the HPI except for urinary frequency  Past Medical History:  Diagnosis Date  . Anemia   . Anxiety   . Asthma    . Chronic back pain    spinal stenosis  . Depression   . Diabetes mellitus    diet controlled  . Diverticulosis   . Essential tremor 06/20/2014  . Fatty liver   . Hx of colonic polyps    adenomatous  . Hyperlipidemia   . Hypothyroidism   . 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 Neurogenic bladder   . Neuromuscular disorder ([place])   . Nocturnal leg cramps 12/02/2017  . Orthostatic hypotension 05/01/2015  . Pseudobulbar affect   . Seizure disorder ([place])   . Syncope, near 05/01/2015  . Tremor     Past Surgical History:  Procedure Laterality Date  . ABDOMINAL HYSTERECTOMY    . CARPAL TUNNEL RELEASE Left   . GASTRIC BYPASS  2005  . Neck Fusion  1998  . NERVE SURGERY     left ulnar nerve transposition  . PANNICULECTOMY  2010  . SPINAL CORD STIMULATOR IMPLANT    . TONSILLECTOMY    . TUBAL LIGATION      Social History [name]  reports that she has never smoked. She has never used smokeless tobacco. She reports that she does not drink alcohol or use drugs.  family history includes Colon cancer in her maternal aunt and maternal uncle; Coronary artery disease in her sister; Diabetes in an other family member; Heart attack (age of onset: 4) in her father; Heart disease in her mother and another family member; Kidney disease in her mother; Other in her son; Seizures in her sister; Tremor  in her mother.  Allergies  Allergen Reactions  . Peanut-Containing Drug Products   . Statins Other (See Comments)    Major headaches  . Sulfa Antibiotics Other (See Comments)    Yeast and thrush infections with just one dose       PHYSICAL EXAMINATION: Vital signs: BP 122/68   Pulse 64   Ht 5\' 1"  (1.549 m)   Wt 154 lb (69.9 kg)   BMI 29.10 kg/m   Constitutional: generally well-appearing, no acute distress Psychiatric: alert and oriented x3, cooperative Eyes: extraocular movements intact, anicteric, conjunctiva pink Mouth: oral  pharynx moist, no lesions Neck: supple no lymphadenopathy Cardiovascular: heart regular rate and rhythm, no murmur Lungs: clear to auscultation bilaterally Abdomen: soft, nontender, nondistended, no obvious ascites, no peritoneal signs, normal bowel sounds, no organomegaly Rectal: Omitted Extremities: no clubbing, cyanosis, or lower extremity edema bilaterally Skin: no lesions on visible extremities Neuro: No focal deficits.  Cranial nerves intact  ASSESSMENT:  1.  GERD.  Symptoms controlled on pantoprazole 2.  Status post Roux-en-Y gastric bypass surgery remotely 3.  Chronic intermittent diarrhea with abdominal cramping consistent with irritable bowel 4.  Occasional incontinence with loose stools 5.  History of adenomatous colon polyps and sessile serrated polyps.  Due for surveillance August 2020   PLAN:  1.  Reflux precautions 2.  Continue pantoprazole 40 mg daily.  Prescription refilled 3.  Continue Bentyl 10 mg daily.  Also told that she could use Bentyl every 4 hours as needed for cramping abdominal pain. 4.  Add Metamucil 2 tablespoons daily to improve stool consistency. 5.  Continue Imodium as needed 6.  Surveillance colonoscopy around August 2020.  She is aware 7.  Interval GI follow-up as needed

## 2018-05-30 ENCOUNTER — Other Ambulatory Visit: Payer: Self-pay | Admitting: Neurology

## 2018-08-30 ENCOUNTER — Other Ambulatory Visit: Payer: Self-pay | Admitting: Neurology

## 2018-10-10 ENCOUNTER — Other Ambulatory Visit: Payer: Self-pay | Admitting: Neurology

## 2018-10-20 ENCOUNTER — Other Ambulatory Visit: Payer: Self-pay | Admitting: Internal Medicine

## 2018-11-14 ENCOUNTER — Encounter: Payer: Self-pay | Admitting: Internal Medicine

## 2018-12-05 ENCOUNTER — Other Ambulatory Visit: Payer: Self-pay

## 2018-12-05 ENCOUNTER — Ambulatory Visit (INDEPENDENT_AMBULATORY_CARE_PROVIDER_SITE_OTHER): Payer: Medicare Other | Admitting: Adult Health

## 2018-12-05 ENCOUNTER — Encounter: Payer: Self-pay | Admitting: Adult Health

## 2018-12-05 VITALS — BP 123/67 | HR 77 | Temp 98.2°F | Ht 61.0 in | Wt 157.0 lb

## 2018-12-05 DIAGNOSIS — G40909 Epilepsy, unspecified, not intractable, without status epilepticus: Secondary | ICD-10-CM

## 2018-12-05 NOTE — Progress Notes (Signed)
I have read the note, and I agree with the clinical assessment and plan.  [name]   

## 2018-12-05 NOTE — Progress Notes (Signed)
PATIENT: [name] DOB: Jun 01, 1960  REASON FOR VISIT: follow up HISTORY FROM: patient  HISTORY OF PRESENT ILLNESS: Today 12/05/18:  [name] is an a 58 year old female with a history of seizures. She returns today for follow-up. She is on tegretol and tolerating it well. Denies any seizures. She is able to complete all ADLs independently. She operates a Teacher, music without difficulty.  No change in her gait or balance.  She does have a deep brain stimulator.  She reports this is significantly improved her essential tremor.  This is managed by [facility].  She returns today for follow-up.  HISTORY [name] is a 58 year old right-handed white female with a history of an essential tremor, status post a deep brain stimulater placement.  She is followed through [facility] for this issue, she recently had a stimulator adjustment on 28 September 2017.  She is quite pleased with the results of the stimulator placements.  She has had some weight gain following the stimulator placement and some difficulty with insomnia.  She has not had any further seizures, she remains on Tegretol.  She tolerates the medication well.  She gets annual blood work to include a Tegretol level through Dr. Forde Dandy in October of each year.  The patient returns for an evaluation.  The carbamazepine level last year was 7.1.  REVIEW OF SYSTEMS: Out of a complete 14 system review of symptoms, the patient complains only of the following symptoms, and all other reviewed systems are negative.  See HPI  ALLERGIES: Allergies  Allergen Reactions  . Peanut-Containing Drug Products   . Statins Other (See Comments)    Major headaches  . Sulfa Antibiotics Other (See Comments)    Yeast and thrush infections with just one dose    HOME MEDICATIONS: Outpatient Medications Prior to Visit  Medication Sig Dispense Refill  . albuterol (VENTOLIN HFA) 108 (90 BASE) MCG/ACT inhaler Inhale 2 puffs into the lungs  every 6 (six) hours as needed.      . B Complex Vitamins (VITAMIN B-COMPLEX) TABS Take 1 tablet by mouth daily.    . baclofen (LIORESAL) 10 MG tablet TAKE ONE-HALF TABLET BY  MOUTH TWO TIMES DAILY 90 tablet 1  . BIOTIN PO Place 10,000 mcg under the tongue daily.    . carbamazepine (CARBATROL) 300 MG 12 hr capsule TAKE 1 CAPSULE BY MOUTH TWO TIMES DAILY 180 capsule 1  . desvenlafaxine (PRISTIQ) 100 MG 24 hr tablet Take 100 mg by mouth at bedtime.     . diclofenac sodium (VOLTAREN) 1 % GEL as needed.    . dicyclomine (BENTYL) 10 MG capsule TAKE 1 CAPSULE BY MOUTH  EVERY MORNING 90 capsule 1  . diphenhydrAMINE (BENADRYL) 25 MG tablet Take 25 mg by mouth.    . DULoxetine (CYMBALTA) 30 MG capsule Take 30 mg by mouth every evening.    . DULoxetine (CYMBALTA) 60 MG capsule Take 60 mg by mouth daily.    Marland Kitchen estradiol (ESTRACE) 1 MG tablet Take 1 mg by mouth daily.     Marland Kitchen ezetimibe (ZETIA) 10 MG tablet Take 10 mg by mouth daily.    . fluticasone (FLONASE) 50 MCG/ACT nasal spray Place 2 sprays into both nostrils every morning.     . Folic Acid-Cholecalciferol 04-4998 MG-UNIT TABS Take 1 tablet by mouth daily.    Marland Kitchen levothyroxine (SYNTHROID, LEVOTHROID) 100 MCG tablet Take 100 mcg by mouth daily.     . lipase/protease/amylase (CREON) 12000 units CPEP  capsule ONE TABLET WITH MEALS AND ONE SNACK - TOTAL OF 4 360 capsule 2  . Loperamide HCl (IMODIUM PO) Take by mouth as needed.    . loratadine (CLARITIN) 10 MG tablet Take 10 mg by mouth every morning.    Marland Kitchen MELATONIN PO Take 1 Dose by mouth at bedtime.    . metoprolol tartrate (LOPRESSOR) 25 MG tablet Take 12.5 mg by mouth 2 (two) times daily.     . nitroGLYCERIN (NITROSTAT) 0.4 MG SL tablet Place 0.4 mg under the tongue every 5 (five) minutes as needed.      . pantoprazole (PROTONIX) 40 MG tablet TAKE 1 TABLET BY MOUTH  DAILY 90 tablet 3  . valACYclovir (VALTREX) 500 MG tablet Take by mouth.    . zolpidem (AMBIEN) 10 MG tablet Take 10 mg by mouth at bedtime as  needed for sleep.     Facility-Administered Medications Prior to Visit  Medication Dose Route Frequency Provider Last Rate Last Dose  . 0.9 %  sodium chloride infusion  500 mL Intravenous Continuous Irene Shipper, MD        PAST MEDICAL HISTORY: Past Medical History:  Diagnosis Date  . Anemia   . Anxiety   . Asthma   . Chronic back pain    spinal stenosis  . Depression   . Diabetes mellitus    diet controlled  . Diverticulosis   . Essential tremor 06/20/2014  . Fatty liver   . Hx of colonic polyps    adenomatous  . Hyperlipidemia   . Hypothyroidism   . 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 Neurogenic bladder   . Neuromuscular disorder ([place])   . Nocturnal leg cramps 12/02/2017  . Orthostatic hypotension 05/01/2015  . Pseudobulbar affect   . Seizure disorder ([place])   . Syncope, near 05/01/2015  . Tremor     PAST SURGICAL HISTORY: Past Surgical History:  Procedure Laterality Date  . ABDOMINAL HYSTERECTOMY    . CARPAL TUNNEL RELEASE Left   . GASTRIC BYPASS  2005  . Neck Fusion  1998  . NERVE SURGERY     left ulnar nerve transposition  . PANNICULECTOMY  2010  . SPINAL CORD STIMULATOR IMPLANT    . TONSILLECTOMY    . TUBAL LIGATION      FAMILY HISTORY: Family History  Problem Relation Age of Onset  . Heart disease Mother   . Kidney disease Mother   . Tremor Mother   . Heart attack Father 18  . Coronary artery disease Sister        x 2  . Seizures Sister   . Colon cancer Maternal Uncle   . Colon cancer Maternal Aunt   . Diabetes Other   . Heart disease Other        paternal side  . Other Son        fatty liver  . Liver disease Neg Hx     SOCIAL HISTORY: Social History   Socioeconomic History  . Marital status: Divorced    Spouse name: Not on file  . Number of children: 4  . Years of education: 34  . Highest education level: Not on file  Occupational History  . Occupation: Disabled    Comment: her  back  Social Needs  . Financial resource strain: Not on file  . Food insecurity    Worry: Not on file    Inability: Not on file  . Transportation  needs    Medical: Not on file    Non-medical: Not on file  Tobacco Use  . Smoking status: Never Smoker  . Smokeless tobacco: Never Used  Substance and Sexual Activity  . Alcohol use: No    Alcohol/week: 0.0 standard drinks    Comment: glass of wine a month  . Drug use: No    Comment: none  . Sexual activity: Not on file  Lifestyle  . Physical activity    Days per week: Not on file    Minutes per session: Not on file  . Stress: Not on file  Relationships  . Social Herbalist on phone: Not on file    Gets together: Not on file    Attends religious service: Not on file    Active member of club or organization: Not on file    Attends meetings of clubs or organizations: Not on file    Relationship status: Not on file  . Intimate partner violence    Fear of current or ex partner: Not on file    Emotionally abused: Not on file    Physically abused: Not on file    Forced sexual activity: Not on file  Other Topics Concern  . Not on file  Social History Narrative   Patient is right handed.   Patient drinks 1 cup of caffeine per day.      PHYSICAL EXAM  Vitals:   12/05/18 1243  BP: 123/67  Pulse: 77  Temp: 98.2 F (36.8 C)  TempSrc: Oral  Weight: 157 lb (71.2 kg)  Height: 5\' 1"  (1.549 m)   Body mass index is 29.1 kg/m.  Generalized: Well developed, in no acute distress   Neurological examination  Mentation: Alert oriented to time, place, history taking. Follows all commands speech and language fluent Cranial nerve II-XII: Pupils were equal round reactive to light. Extraocular movements were full, visual field were full on confrontational test. . Head turning and shoulder shrug  were normal and symmetric. Motor: The motor testing reveals 5 over 5 strength of all 4 extremities. Good symmetric motor tone is  noted throughout.  Sensory: Sensory testing is intact to soft touch on all 4 extremities. No evidence of extinction is noted.  Coordination: Cerebellar testing reveals good finger-nose-finger and heel-to-shin bilaterally.  Gait and station: Gait is normal.  Tandem gait slightly unsteady Reflexes: Deep tendon reflexes are symmetric and normal bilaterally.   DIAGNOSTIC DATA (LABS, IMAGING, TESTING) - I reviewed patient records, labs, notes, testing and imaging myself where available.  Lab Results  Component Value Date   WBC 4.4 08/28/2015   HGB 11.9 08/28/2015   HCT 35.8 08/28/2015   MCV 97 08/28/2015   PLT 208 08/28/2015      Component Value Date/Time   NA 141 08/30/2015 1217   K 5.4 (H) 08/30/2015 1217   CL 106 08/30/2015 1217   CO2 23 08/30/2015 1217   GLUCOSE 97 08/30/2015 1217   GLUCOSE 90 03/23/2014 1515   BUN 18 08/30/2015 1217   CREATININE 0.83 08/30/2015 1217   CALCIUM 8.6 (L) 08/30/2015 1217   PROT 6.0 08/28/2015 1320   ALBUMIN 4.0 08/28/2015 1320   AST 14 08/28/2015 1320   ALT 11 08/28/2015 1320   ALKPHOS 75 08/28/2015 1320   BILITOT <0.2 08/28/2015 1320   GFRNONAA 79 08/30/2015 1217   GFRAA 91 08/30/2015 1217    Lab Results  Component Value Date   HGBA1C (H) 05/15/2010    6.2 (NOTE)  According to the ADA Clinical Practice Recommendations for 2011, when HbA1c is used as a screening test:   >=6.5%   Diagnostic of Diabetes Mellitus           (if abnormal result  is confirmed)  5.7-6.4%   Increased risk of developing Diabetes Mellitus  References:Diagnosis and Classification of Diabetes Mellitus,Diabetes [id number] 1):S62-S69 and Standards of Medical Care in         Diabetes - 2011,Diabetes [id number]  (Suppl 1):S11-S61.   Lab Results  Component Value Date   [id number] 11/04/2010   No results found for: TSH    ASSESSMENT AND PLAN 58 y.o. 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 here with:  1: Seizures  Overall patient is doing well. Continue on Carbamazepine.  She will have blood work through her primary care provider.. Patient advised that if she has any seizure events to please let us know. Follow-up in 1 year or sooner if needed.    I spent 15 minutes with the patient. 50% of this time was spent reviewing medication and seizure precautions.    Ward Givens, MSN, NP-C 12/05/2018, 12:46 PM [facility] [address], [place], [place] [address] [phone]

## 2018-12-05 NOTE — Patient Instructions (Signed)
Your Plan:  Continue Carbamazepine If your symptoms worsen or you develop new symptoms please let us know.   Thank you for coming to see [place] at [facility]. I hope we have been able to provide you high quality care today.  You may receive a patient satisfaction survey over the next few weeks. We would appreciate your feedback and comments so that we may continue to improve ourselves and the health of our patients.

## 2018-12-06 ENCOUNTER — Encounter: Payer: Self-pay | Admitting: Internal Medicine

## 2018-12-27 ENCOUNTER — Other Ambulatory Visit: Payer: Self-pay

## 2018-12-27 ENCOUNTER — Ambulatory Visit (AMBULATORY_SURGERY_CENTER): Payer: Self-pay

## 2018-12-27 VITALS — Ht 61.0 in | Wt 155.8 lb

## 2018-12-27 DIAGNOSIS — Z86010 Personal history of colonic polyps: Secondary | ICD-10-CM

## 2018-12-27 MED ORDER — NA SULFATE-K SULFATE-MG SULF 17.5-3.13-1.6 GM/177ML PO SOLN: 1.0000 | Freq: Once | ORAL | 0 refills | Status: AC

## 2018-12-27 NOTE — Progress Notes (Signed)
Denies allergies to eggs or soy products. Denies complication of anesthesia or sedation. Denies use of weight loss medication. Denies use of O2.   Emmi instructions given for colonoscopy.  

## 2018-12-29 ENCOUNTER — Encounter: Payer: Self-pay | Admitting: Internal Medicine

## 2019-01-09 ENCOUNTER — Telehealth: Payer: Self-pay

## 2019-01-09 NOTE — Telephone Encounter (Signed)
Covid-19 screening questions   Do you now or have you had a fever in the last 14 days? NO   Do you have any respiratory symptoms of shortness of breath or cough now or in the last 14 days? NO  Do you have any family members or close contacts with diagnosed or suspected Covid-19 in the past 14 days? NO  Have you been tested for Covid-19 and found to be positive? NO        

## 2019-01-10 ENCOUNTER — Other Ambulatory Visit: Payer: Self-pay

## 2019-01-10 ENCOUNTER — Ambulatory Visit (AMBULATORY_SURGERY_CENTER): Payer: Medicare Other | Admitting: Internal Medicine

## 2019-01-10 ENCOUNTER — Encounter: Payer: Self-pay | Admitting: Internal Medicine

## 2019-01-10 VITALS — BP 128/70 | HR 62 | Temp 98.5°F | Resp 18 | Ht 61.0 in | Wt 155.0 lb

## 2019-01-10 DIAGNOSIS — Z86010 Personal history of colonic polyps: Secondary | ICD-10-CM

## 2019-01-10 DIAGNOSIS — K635 Polyp of colon: Secondary | ICD-10-CM

## 2019-01-10 DIAGNOSIS — D122 Benign neoplasm of ascending colon: Secondary | ICD-10-CM

## 2019-01-10 MED ORDER — SODIUM CHLORIDE 0.9 % IV SOLN: 500.0000 mL | Freq: Once | INTRAVENOUS | Status: DC

## 2019-01-10 NOTE — Op Note (Signed)
[place] Patient Name: [name] Procedure Date: 01/10/2019 9:11 AM MRN: [id number] Endoscopist: Docia Chuck. Henrene Pastor , MD Age: 58 Referring MD:  Date of Birth: 1960-07-14 Gender: Female Account #: [id number] Procedure:                Colonoscopy with cold snare polypectomy x1 Indications:              High risk colon cancer surveillance: Personal                            history of multiple (3 or more) adenomas, High risk                            colon cancer surveillance: Personal history of                            sessile serrated colon polyp (less than 10 mm in                            size) with no dysplasia. Previous examinations                            2004, 2012 [facility]), 2017. Medicines:                Monitored Anesthesia Care Procedure:                Pre-Anesthesia Assessment:                           - Prior to the procedure, a History and Physical                            was performed, and patient medications and                            allergies were reviewed. The patient's tolerance of                            previous anesthesia was also reviewed. The risks                            and benefits of the procedure and the sedation                            options and risks were discussed with the patient.                            All questions were answered, and informed consent                            was obtained. Prior Anticoagulants: The patient has                            taken no previous anticoagulant or antiplatelet  agents. ASA Grade Assessment: II - A patient with                            mild systemic disease. After reviewing the risks                            and benefits, the patient was deemed in                            satisfactory condition to undergo the procedure.                           After obtaining informed consent, the colonoscope                            was  passed under direct vision. Throughout the                            procedure, the patient's blood pressure, pulse, and                            oxygen saturations were monitored continuously. The                            Colonoscope was introduced through the anus and                            advanced to the the cecum, identified by                            appendiceal orifice and ileocecal valve. The                            ileocecal valve, appendiceal orifice, and rectum                            were photographed. The quality of the bowel                            preparation was good. The colonoscopy was performed                            without difficulty. The patient tolerated the                            procedure well. The bowel preparation used was                            SUPREP via split dose instruction. Scope In: 9:26:40 AM Scope Out: 9:38:47 AM Scope Withdrawal Time: 0 hours 8 minutes 56 seconds  Total Procedure Duration: 0 hours 12 minutes 7 seconds  Findings:                 A 3 mm polyp was found in the ascending colon.  The                            polyp was sessile. The polyp was removed with a                            cold snare. Resection and retrieval were complete.                           The exam was otherwise without abnormality on                            direct and retroflexion views. Complications:            No immediate complications. Estimated blood loss:                            None. Estimated Blood Loss:     Estimated blood loss: none. Impression:               - One 3 mm polyp in the ascending colon, removed                            with a cold snare. Resected and retrieved.                           - The examination was otherwise normal on direct                            and retroflexion views. Recommendation:           - Repeat colonoscopy in 5 years for surveillance.                           - Patient has a  contact number available for                            emergencies. The signs and symptoms of potential                            delayed complications were discussed with the                            patient. Return to normal activities tomorrow.                            Written discharge instructions were provided to the                            patient.                           - Resume previous diet.                           - Continue present medications.                           -  Await pathology results. Docia Chuck. Henrene Pastor, MD 01/10/2019 9:42:44 AM This report has been signed electronically.

## 2019-01-10 NOTE — Progress Notes (Signed)
Temp check by JB/vital check by CW.  Patient states no changes in medical or surgical history since pre-visit screening on 12/27/18.

## 2019-01-10 NOTE — Patient Instructions (Signed)
   INFORMATION ON POLYPS GIVEN TO YOU TODAY   AWAIT PATHOLOGY RESULTS ON POLYP REMOVED     YOU HAD AN ENDOSCOPIC PROCEDURE TODAY AT THE [facility]:   Refer to the procedure report that was given to you for any specific questions about what was found during the examination.  If the procedure report does not answer your questions, please call your gastroenterologist to clarify.  If you requested that your care partner not be given the details of your procedure findings, then the procedure report has been included in a sealed envelope for you to review at your convenience later.  YOU SHOULD EXPECT: Some feelings of bloating in the abdomen. Passage of more gas than usual.  Walking can help get rid of the air that was put into your GI tract during the procedure and reduce the bloating. If you had a lower endoscopy (such as a colonoscopy or flexible sigmoidoscopy) you may notice spotting of blood in your stool or on the toilet paper. If you underwent a bowel prep for your procedure, you may not have a normal bowel movement for a few days.  Please Note:  You might notice some irritation and congestion in your nose or some drainage.  This is from the oxygen used during your procedure.  There is no need for concern and it should clear up in a day or so.  SYMPTOMS TO REPORT IMMEDIATELY:   Following lower endoscopy (colonoscopy or flexible sigmoidoscopy):  Excessive amounts of blood in the stool  Significant tenderness or worsening of abdominal pains  Swelling of the abdomen that is new, acute  Fever of 100F or higher    For urgent or emergent issues, a gastroenterologist can be reached at any hour by calling [phone].   DIET:  We do recommend a small meal at first, but then you may proceed to your regular diet.  Drink plenty of fluids but you should avoid alcoholic beverages for 24 hours.  ACTIVITY:  You should plan to take it easy for the rest of today and you should NOT  DRIVE or use heavy machinery until tomorrow (because of the sedation medicines used during the test).    FOLLOW UP: Our staff will call the number listed on your records 48-72 hours following your procedure to check on you and address any questions or concerns that you may have regarding the information given to you following your procedure. If we do not reach you, we will leave a message.  We will attempt to reach you two times.  During this call, we will ask if you have developed any symptoms of COVID 19. If you develop any symptoms (ie: fever, flu-like symptoms, shortness of breath, cough etc.) before then, please call [phone].  If you test positive for Covid 19 in the 2 weeks post procedure, please call and report this information to us.    If any biopsies were taken you will be contacted by phone or by letter within the next 1-3 weeks.  Please call us at [phone] if you have not heard about the biopsies in 3 weeks.    SIGNATURES/CONFIDENTIALITY: You and/or your care partner have signed paperwork which will be entered into your electronic medical record.  These signatures attest to the fact that that the information above on your After Visit Summary has been reviewed and is understood.  Full responsibility of the confidentiality of this discharge information lies with you and/or your care-partner. 

## 2019-01-10 NOTE — Progress Notes (Signed)
Called to room to assist during endoscopic procedure.  Patient ID and intended procedure confirmed with present staff. Received instructions for my participation in the procedure from the performing physician.  

## 2019-01-12 ENCOUNTER — Telehealth: Payer: Self-pay

## 2019-01-12 ENCOUNTER — Encounter: Payer: Self-pay | Admitting: Internal Medicine

## 2019-01-12 NOTE — Telephone Encounter (Signed)
  Follow up Call-  Call back number 01/10/2019  Post procedure Call Back phone  # [phone]  Permission to leave phone message Yes  Some recent data might be hidden     Patient questions:  Do you have a fever, pain , or abdominal swelling? No. Pain Score  0 *  Have you tolerated food without any problems? Yes.    Have you been able to return to your normal activities? Yes.    Do you have any questions about your discharge instructions: Diet   No. Medications  No. Follow up visit  No.  Do you have questions or concerns about your Care? No.  Actions: * If pain score is 4 or above: 1. No action needed, pain <4.Have you developed a fever since your procedure? no  2.   Have you had an respiratory symptoms (SOB or cough) since your procedure? no  3.   Have you tested positive for COVID 19 since your procedure no  4.   Have you had any family members/close contacts diagnosed with the COVID 19 since your procedure?  no   If yes to any of these questions please route to Joylene John, RN and Alphonsa Gin, Therapist, sports.

## 2019-01-24 ENCOUNTER — Other Ambulatory Visit (HOSPITAL_COMMUNITY): Payer: Self-pay | Admitting: *Deleted

## 2019-01-25 ENCOUNTER — Ambulatory Visit (HOSPITAL_COMMUNITY)
Admission: RE | Admit: 2019-01-25 | Discharge: 2019-01-25 | Disposition: A | Discharge status: Home / Self Care | Payer: Medicare Other | Source: Ambulatory Visit | Attending: Endocrinology | Admitting: Endocrinology

## 2019-01-25 ENCOUNTER — Other Ambulatory Visit: Payer: Self-pay

## 2019-01-25 DIAGNOSIS — D649 Anemia, unspecified: Secondary | ICD-10-CM | POA: Insufficient documentation

## 2019-01-25 MED ORDER — SODIUM CHLORIDE 0.9 % IV SOLN
510.0000 mg | Freq: Once | INTRAVENOUS | Status: AC
  Administered 2019-01-25: 510 mg via INTRAVENOUS
  Filled 2019-01-25: qty 17

## 2019-02-03 ENCOUNTER — Other Ambulatory Visit: Payer: Self-pay | Admitting: Neurology

## 2019-05-31 ENCOUNTER — Ambulatory Visit
Admission: RE | Admit: 2019-05-31 | Discharge: 2019-05-31 | Disposition: A | Discharge status: Home / Self Care | Payer: Medicare Other | Source: Ambulatory Visit | Attending: Endocrinology | Admitting: Endocrinology

## 2019-05-31 ENCOUNTER — Other Ambulatory Visit: Payer: Self-pay | Admitting: Endocrinology

## 2019-05-31 DIAGNOSIS — T17900A Unspecified foreign body in respiratory tract, part unspecified causing asphyxiation, initial encounter: Secondary | ICD-10-CM

## 2019-05-31 DIAGNOSIS — R062 Wheezing: Secondary | ICD-10-CM

## 2019-07-11 ENCOUNTER — Other Ambulatory Visit: Payer: Self-pay | Admitting: Neurology

## 2019-09-28 ENCOUNTER — Other Ambulatory Visit: Payer: Self-pay | Admitting: Adult Health

## 2019-10-10 ENCOUNTER — Telehealth: Payer: Self-pay | Admitting: Internal Medicine

## 2019-10-10 NOTE — Telephone Encounter (Signed)
Pt states she has been having issues with vomiting off and on for years post gastric bypass surgery. Pt also states she has a hernia and she had a modified barium swallow done at [facility] and was told the food was not going through as it should. States Dr. Forde Dandy wanted her to follow up with Dr. Henrene Pastor. Pt states she will bring report or try to send it via mychart. Scheduled pt to see Dr. Henrene Pastor 11/17/19@8 :40am, pt aware of appt.

## 2019-10-10 NOTE — Telephone Encounter (Signed)
Patient called states she has been throwing up consistently please advise

## 2019-11-06 ENCOUNTER — Other Ambulatory Visit: Payer: Self-pay | Admitting: Internal Medicine

## 2019-11-17 ENCOUNTER — Encounter: Payer: Self-pay | Admitting: Internal Medicine

## 2019-11-17 ENCOUNTER — Ambulatory Visit: Payer: Medicare Other | Admitting: Internal Medicine

## 2019-11-17 VITALS — BP 132/64 | HR 80 | Ht 61.0 in | Wt 153.0 lb

## 2019-11-17 DIAGNOSIS — R112 Nausea with vomiting, unspecified: Secondary | ICD-10-CM | POA: Diagnosis not present

## 2019-11-17 DIAGNOSIS — R194 Change in bowel habit: Secondary | ICD-10-CM

## 2019-11-17 DIAGNOSIS — K219 Gastro-esophageal reflux disease without esophagitis: Secondary | ICD-10-CM

## 2019-11-17 MED ORDER — PANTOPRAZOLE SODIUM 40 MG PO TBEC: 40.0000 mg | DELAYED_RELEASE_TABLET | Freq: Two times a day (BID) | ORAL | 3 refills | Status: DC

## 2019-11-17 NOTE — Patient Instructions (Signed)
If you are age 61 or older, your body mass index should be between 23-30. Your Body mass index is 28.91 kg/m. If this is out of the aforementioned range listed, please consider follow up with your Primary Care Provider.  If you are age 60 or younger, your body mass index should be between 19-25. Your Body mass index is 28.91 kg/m. If this is out of the aformentioned range listed, please consider follow up with your Primary Care Provider.   You have been scheduled for an endoscopy. Please follow written instructions given to you at your visit today. If you use inhalers (even only as needed), please bring them with you on the day of your procedure.  INCREASE your Pantoprazole 40 mg to twice a day.  START using Metamucil 1-2 teaspoons daily.  Follow up pending the results of you Endoscopy or as needed.

## 2019-11-17 NOTE — Progress Notes (Signed)
HISTORY OF PRESENT ILLNESS:  [name] is a 59 y.o. female with diabetes mellitus, seizure disorder, asthma, chronic back pain, and prior Roux-en-Y gastric bypass surgery 2007.  Patient also has a history of adenomatous colon polyps.  She was last evaluated in the office May 24, 2018.  At that time she was taking pantoprazole 40 mg daily for GERD, which was effective.  She was also having chronic intermittent problems with loose stools, abdominal cramping, and occasional incontinence.  She was advised to take Metamucil and prescribed Bentyl.  Surveillance colonoscopy was subsequently performed January 10, 2019.  She was found to have sessile serrated polyp.  Examination was otherwise normal.  Follow-up in 5 years recommended.  She presents today with chief complaint of intermittent postprandial vomiting.  She states that this has been going on for about 5 years.  It occurs immediately after eating or with meals.  She states that she does watch the size or proportions when eating.  Carbohydrates seem to bother her the most.  She states that problems with vomiting occur once or twice per month.  She also reports problems with sore throat and hoarseness as well as breakthrough reflux symptoms.  She did undergo upper endoscopy in 2012 which revealed normal postoperative anatomy.  Patient had sent to me and brings with her a pharyngeal function study that was performed at [facility] July 19, 2019.  She was felt to have esophageal dysmotility.  Barium esophagram suggested.  Patient also complains of bowel habits that are now alternating.  She is not on fiber.  Her weight is unchanged from 18 months ago.  Finally, she was worried that she might have an abdominal hernia in the right mid abdomen.  REVIEW OF SYSTEMS:  All non-GI ROS negative unless otherwise stated in the HPI except for allergies, arthritis, back pain, cough, fatigue, itching, headaches, sleeping problems, sore throat, voice change,  shortness of breath  Past Medical History:  Diagnosis Date  . Allergy   . Anemia   . Anxiety   . Arthritis   . Asthma   . Cataract   . Chronic back pain    spinal stenosis  . Depression   . Diabetes mellitus    diet controlled  . Diverticulosis   . Essential tremor 06/20/2014  . Fatty liver   . GERD (gastroesophageal reflux disease)   . Hx of colonic polyps    adenomatous  . Hyperlipidemia   . Hypertension   . Hypothyroidism   . 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 Neurogenic bladder   . Neuromuscular disorder ([place])   . Nocturnal leg cramps 12/02/2017  . Orthostatic hypotension 05/01/2015  . Osteopenia   . Pseudobulbar affect   . Seizure disorder ([place])   . Sleep apnea   . Syncope, near 05/01/2015  . Tremor     Past Surgical History:  Procedure Laterality Date  . ABDOMINAL HYSTERECTOMY    . CARPAL TUNNEL RELEASE Left   . DEEP BRAIN STIMULATOR PLACEMENT    . GASTRIC BYPASS  2005  . Neck Fusion  1998  . NERVE SURGERY     left ulnar nerve transposition  . PANNICULECTOMY  2010  . SPINAL CORD STIMULATOR IMPLANT    . TONSILLECTOMY    . TUBAL LIGATION      Social History [name]  reports that she has never smoked. She has never used smokeless tobacco. She reports current alcohol use.  She reports that she does not use drugs.  family history includes Colon cancer in her maternal aunt and maternal uncle; Coronary artery disease in her sister; Diabetes in an other family member; Heart attack (age of onset: 59) in her father; Heart disease in her mother and another family member; Kidney disease in her mother; Other in her son; Seizures in her sister; Tremor in her mother.  Allergies  Allergen Reactions  . Peanut-Containing Drug Products   . Statins Other (See Comments)    Major headaches  . Sulfa Antibiotics Other (See Comments)    Yeast and thrush infections with just one dose       PHYSICAL  EXAMINATION: Vital signs: BP 132/64 (BP Location: Left Arm, Patient Position: Sitting, Cuff Size: Normal)   Pulse 80   Ht 5\' 1"  (1.549 m) Comment: height measured without shoes  Wt 153 lb (69.4 kg)   BMI 28.91 kg/m   Constitutional: generally well-appearing, no acute distress Psychiatric: alert and oriented x3, cooperative Eyes: extraocular movements intact, anicteric, conjunctiva pink Mouth: oral pharynx moist, no lesions Neck: supple no lymphadenopathy Cardiovascular: heart regular rate and rhythm, no murmur Lungs: clear to auscultation bilaterally Abdomen: soft, nontender, nondistended, no obvious ascites, no peritoneal signs, normal bowel sounds, no organomegaly.  No hernia Rectal: Omitted Extremities: no clubbing, cyanosis, or lower extremity edema bilaterally Skin: no lesions on visible extremities Neuro: No focal deficits.  Cranial nerves intact  ASSESSMENT:  1.  GERD.  Experiencing breakthrough symptoms 2.  Intermittent postprandial vomiting past 5 years patient with prior Roux-en-Y gastric bypass surgery.  Rule out anastomotic stricturing.  Rule out overeating 3.  Status post Roux-en-Y gastric bypass surgery 4.  Bowel habits 5.  History of adenomatous polyps and SSP.  Surveillance up-to-date.  Last examination 2020 6.  Multiple medical problems   PLAN:  1.  Reflux precautions 2.  Prescribed pantoprazole 3 mg TWICE daily.  Medication risks reviewed 3.  Schedule upper endoscopy to rule out anastomotic stricturing or other problems such as anastomotic ulcer.The nature of the procedure, as well as the risks, benefits, and alternatives were carefully and thoroughly reviewed with the patient. Ample time for discussion and questions allowed. The patient understood, was satisfied, and agreed to proceed. 4.  Daily fiber supplementation with Metamucil 2 tablespoons alternating bowel habits.  I explained her the rationale behind this therapy 5.  If upper endoscopy unrevealing, would  proceed with upper GI series to rule out any obstructing process Total time of 40 minutes was spent preparing to see the patient, reviewing outside tests, obtaining interval history, performing comprehensive physical exam, counseling the patient regarding her multiple above listed issues, ordering medications and advanced procedures, and documenting clinical information in health record

## 2019-12-04 ENCOUNTER — Ambulatory Visit: Payer: Medicare Other | Admitting: Adult Health

## 2019-12-07 ENCOUNTER — Other Ambulatory Visit: Payer: Self-pay

## 2019-12-07 ENCOUNTER — Telehealth: Payer: Self-pay | Admitting: Internal Medicine

## 2019-12-07 MED ORDER — DICYCLOMINE HCL 10 MG PO CAPS: 10.0000 mg | ORAL_CAPSULE | Freq: Every morning | ORAL | 1 refills | Status: DC

## 2019-12-07 NOTE — Telephone Encounter (Signed)
Pt states she wants to cancel the EGD. States Dr. Henrene Pastor asked her if she was overeating. She states she may be and she wants to try cutting back and working on her diet before having an EGD. Dr. Henrene Pastor notified.

## 2019-12-07 NOTE — Telephone Encounter (Signed)
We also discussed anastomotic stricturing. She should reschedule EGD if dietary change does not help. Thanks

## 2019-12-07 NOTE — Telephone Encounter (Signed)
Pt states she will call back if dietary changes do not help.

## 2019-12-19 ENCOUNTER — Encounter: Payer: Medicare Other | Admitting: Internal Medicine

## 2020-05-31 ENCOUNTER — Other Ambulatory Visit: Payer: Self-pay | Admitting: Internal Medicine

## 2020-09-05 ENCOUNTER — Other Ambulatory Visit: Payer: Self-pay | Admitting: Internal Medicine

## 2020-10-03 ENCOUNTER — Other Ambulatory Visit: Payer: Self-pay | Admitting: Internal Medicine

## 2020-11-02 ENCOUNTER — Other Ambulatory Visit: Payer: Self-pay | Admitting: Internal Medicine

## 2020-12-09 ENCOUNTER — Other Ambulatory Visit: Payer: Self-pay | Admitting: Internal Medicine

## 2021-01-06 ENCOUNTER — Other Ambulatory Visit: Payer: Self-pay | Admitting: Internal Medicine

## 2021-02-28 ENCOUNTER — Telehealth: Payer: Self-pay | Admitting: Internal Medicine

## 2021-02-28 MED ORDER — PANTOPRAZOLE SODIUM 40 MG PO TBEC: 40.0000 mg | DELAYED_RELEASE_TABLET | Freq: Two times a day (BID) | ORAL | 3 refills | Status: DC

## 2021-02-28 MED ORDER — DICYCLOMINE HCL 10 MG PO CAPS: ORAL_CAPSULE | ORAL | 3 refills | Status: DC

## 2021-02-28 NOTE — Telephone Encounter (Signed)
Sent Bentyl and Pantoprazole to [facility] in [facility]

## 2021-02-28 NOTE — Telephone Encounter (Signed)
Patient is dropping her mail-order pharmacy and would like her Bentyl and Pantoprazole prescriptions sent to the [facility] [address] in [facility].  Any questions, please call [phone].

## 2021-03-31 ENCOUNTER — Other Ambulatory Visit: Payer: Self-pay | Admitting: Internal Medicine

## 2021-05-26 ENCOUNTER — Other Ambulatory Visit: Payer: Self-pay | Admitting: Internal Medicine

## 2021-08-12 ENCOUNTER — Encounter: Payer: Self-pay | Admitting: Internal Medicine

## 2021-08-12 ENCOUNTER — Ambulatory Visit: Payer: Medicare Other | Admitting: Internal Medicine

## 2021-08-12 ENCOUNTER — Other Ambulatory Visit: Payer: Self-pay | Admitting: Internal Medicine

## 2021-08-12 VITALS — BP 108/62 | HR 70 | Ht 61.0 in | Wt 129.0 lb

## 2021-08-12 DIAGNOSIS — R195 Other fecal abnormalities: Secondary | ICD-10-CM

## 2021-08-12 DIAGNOSIS — Z86010 Personal history of colonic polyps: Secondary | ICD-10-CM | POA: Diagnosis not present

## 2021-08-12 DIAGNOSIS — K5909 Other constipation: Secondary | ICD-10-CM

## 2021-08-12 DIAGNOSIS — R112 Nausea with vomiting, unspecified: Secondary | ICD-10-CM | POA: Diagnosis not present

## 2021-08-12 DIAGNOSIS — K219 Gastro-esophageal reflux disease without esophagitis: Secondary | ICD-10-CM

## 2021-08-12 MED ORDER — PLENVU 140 G PO SOLR: 1.0000 | Freq: Once | ORAL | 0 refills | Status: AC

## 2021-08-12 NOTE — Patient Instructions (Signed)
If you are age 63 or older, your body mass index should be between 23-30. Your Body mass index is 24.37 kg/m?Marland Kitchen If this is out of the aforementioned range listed, please consider follow up with your Primary Care Provider. ? ?If you are age 77 or younger, your body mass index should be between 19-25. Your Body mass index is 24.37 kg/m?Marland Kitchen If this is out of the aformentioned range listed, please consider follow up with your Primary Care Provider.  ? ?________________________________________________________ ? ?The [facility] providers would like to encourage you to use [facility] to communicate with providers for non-urgent requests or questions.  Due to long hold times on the telephone, sending your provider a message by [place] may be a faster and more efficient way to get a response.  Please allow 48 business hours for a response.  Please remember that this is for non-urgent requests.  ?_______________________________________________________ ? ?You have been scheduled for an endoscopy and colonoscopy. Please follow written instructions given to you at your visit today. ?If you use inhalers (even only as needed), please bring them with you on the day of your procedure. ? ?

## 2021-08-12 NOTE — Progress Notes (Signed)
HISTORY OF PRESENT ILLNESS: ? ?[name] is a 61 y.o. female with multiple medical problems as listed below including Roux-en-Y gastric bypass surgery in 2007.  She was last seen in this office November 17, 2019 regarding GERD with breakthrough symptoms, intermittent postprandial vomiting of 5 years duration, and change in bowel habits.  She also has a history of adenomatous and sessile serrated polyps.  See that dictation for details. ? ?Patient sent today by her primary care provider Dr. Forde Dandy regarding Hemoccult positive stool.  Outside laboratory reviewed for confirmation.  She states this was done as part of her annual physical exam.  This was collected July 31, 2021.  Patient denies melena or hematochezia.  She has been watching her diet and walking with additional weight loss.  She is pleased.  Review of outside blood work from July 09, 2021 shows unremarkable comprehensive metabolic panel.  Unremarkable CBC with hemoglobin 14.1.  Prior colonoscopy examinations 2004, 2012, 2017, 2020.  Last examination with a sessile serrated polyp.  Otherwise normal.  Follow-up in 5 years recommended.  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  No acute findings.  Abdominal ultrasound 2015.  Normal gallbladder.  Last upper endoscopy 2012 was normal status post Roux-en-Y gastric bypass.  Upper GI series 2015 unremarkable post gastric bypass surgery. ? ?Patient tells me that she has been watching her diet.  She has less problems with vomiting, though will vomit approximately once per week.  She is also been having some issues with hoarseness for which she saw an ENT.  This was attributed to reflux disease.  In addition to pantoprazole twice daily she was placed on famotidine at night.  She thinks this may have helped some though she still has hoarseness.  She does report some constipation.  She prefers this over diarrhea, which is associated with episodes of incontinence.  Fiber did not  help. ? ?REVIEW OF SYSTEMS: ? ?All non-GI ROS negative unless otherwise stated in the HPI except for arthritis, allergies ? ?Past Medical History:  ?Diagnosis Date  ? Allergy   ? Anemia   ? Anxiety   ? Arthritis   ? Asthma   ? Cataract   ? Chronic back pain   ? spinal stenosis  ? Depression   ? Diabetes mellitus   ? diet controlled  ? Diverticulosis   ? Essential tremor 06/20/2014  ? Fatty liver   ? GERD (gastroesophageal reflux disease)   ? Hx of colonic polyps   ? adenomatous  ? Hyperlipidemia   ? Hypertension   ? Hypothyroidism   ? 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 Neurogenic bladder   ? Neuromuscular disorder ([place])   ? Nocturnal leg cramps 12/02/2017  ? Orthostatic hypotension 05/01/2015  ? Osteopenia   ? Pseudobulbar affect   ? Seizure disorder ([place])   ? Sleep apnea   ? Syncope, near 05/01/2015  ? Tremor   ? ? ?Past Surgical History:  ?Procedure Laterality Date  ? ABDOMINAL HYSTERECTOMY    ? CARPAL TUNNEL RELEASE Left   ? DEEP BRAIN STIMULATOR PLACEMENT    ? GASTRIC BYPASS  2005  ? Neck Fusion  1998  ? NERVE SURGERY    ? left ulnar nerve transposition  ? PANNICULECTOMY  2010  ? SPINAL CORD STIMULATOR IMPLANT    ? TONSILLECTOMY    ? TUBAL LIGATION    ? ? ?Social History ?[name]  reports that she has never smoked. She has never used smokeless tobacco. She reports current alcohol use. She reports that she does not use drugs. ? ?family history includes Colon cancer in her maternal aunt and maternal uncle; Coronary artery disease in her sister; Diabetes in an other family member; Heart attack (age of onset: 43) in her father; Heart disease in her mother and another family member; Kidney disease in her mother; Other in her son; Seizures in her sister; Tremor in her mother. ? ?Allergies  ?Allergen Reactions  ? Peanut-Containing Drug Products   ? Statins Other (See Comments)  ?  Major headaches  ? Sulfa Antibiotics Other (See Comments)  ?  Yeast and thrush  infections with just one dose  ? ? ?  ? ?PHYSICAL EXAMINATION: ?Vital signs: BP 108/62   Pulse 70   Ht '5\' 1"'$  (1.549 m)   Wt 129 lb (58.5 kg)   BMI 24.37 kg/m?   ?Constitutional: generally well-appearing, no acute distress ?Psychiatric: alert and oriented x3, cooperative ?Eyes: extraocular movements intact, anicteric, conjunctiva pink ?Mouth: oral pharynx moist, no lesions ?Neck: supple no lymphadenopathy ?Cardiovascular: heart regular rate and rhythm, no murmur ?Lungs: clear to auscultation bilaterally ?Abdomen: soft, nontender, nondistended, no obvious ascites, no peritoneal signs, normal bowel sounds, no organomegaly ?Rectal: Deferred until colonoscopy ?Extremities: no clubbing, cyanosis, or lower extremity edema bilaterally ?Skin: no lesions on visible extremities ?Neuro: No focal deficits.  Cranial nerves intact ? ?ASSESSMENT: ? ?1.  Hemoccult positive stool.  No significant GI symptoms (no new symptoms).  Normal hemoglobin ?2.  History of multiple adenomatous colon polyps and sessile serrated polyp.  Last colonoscopy 2020. ?3.  Status post Roux-en-Y gastric bypass surgery ?4.  Intermittent problems with nausea and vomiting.  Chronic post gastric bypass.  Better when she is careful with her diet ?5.  Chronic GERD.  Classic symptoms controlled with PPI therapy ?6.  Hoarseness.  ENT attributes to GERD.  Placed on at night H2 receptor antagonist in addition to twice daily PPI ?7.  Multiple medical problems ? ? ?PLAN: ? ?1.  Schedule colonoscopy to evaluate Hemoccult-positive stool.The nature of the procedure, as well as the risks, benefits, and alternatives were carefully and thoroughly reviewed with the patient. Ample time for discussion and questions allowed. The patient understood, was satisfied, and agreed to proceed.  ?2.  Schedule upper endoscopy to evaluate Hemoccult-positive stool.The nature of the procedure, as well as the risks, benefits, and alternatives were carefully and thoroughly reviewed with  the patient. Ample time for discussion and questions allowed. The patient understood, was satisfied, and agreed to proceed.  ?3.  Continue PPI to control GERD symptoms ?4.  Ongoing general medical care with PCP ?A total time of 30 minutes was spent preparing to see the patient, reviewing outside records, obtaining history, performing physical examination, counseling the patient and educating the patient regarding the above listed issues, ordering endoscopic procedures, and documenting clinical information in the health record ? ? ? ?  ?

## 2021-09-11 ENCOUNTER — Encounter: Payer: Self-pay | Admitting: Internal Medicine

## 2021-09-18 ENCOUNTER — Ambulatory Visit (AMBULATORY_SURGERY_CENTER): Payer: Medicare Other | Admitting: Internal Medicine

## 2021-09-18 ENCOUNTER — Encounter: Payer: Self-pay | Admitting: Internal Medicine

## 2021-09-18 VITALS — BP 132/61 | HR 58 | Temp 98.0°F | Resp 13 | Ht 61.0 in | Wt 129.0 lb

## 2021-09-18 DIAGNOSIS — K219 Gastro-esophageal reflux disease without esophagitis: Secondary | ICD-10-CM | POA: Diagnosis not present

## 2021-09-18 DIAGNOSIS — D120 Benign neoplasm of cecum: Secondary | ICD-10-CM

## 2021-09-18 DIAGNOSIS — R195 Other fecal abnormalities: Secondary | ICD-10-CM

## 2021-09-18 DIAGNOSIS — K635 Polyp of colon: Secondary | ICD-10-CM | POA: Diagnosis not present

## 2021-09-18 DIAGNOSIS — Z86010 Personal history of colonic polyps: Secondary | ICD-10-CM

## 2021-09-18 DIAGNOSIS — D122 Benign neoplasm of ascending colon: Secondary | ICD-10-CM

## 2021-09-18 MED ORDER — SODIUM CHLORIDE 0.9 % IV SOLN: 500.0000 mL | Freq: Once | INTRAVENOUS | Status: DC

## 2021-09-18 NOTE — Op Note (Signed)
[facility] Patient Name: [name] Procedure Date: 09/18/2021 11:30 AM MRN: [id number] Endoscopist: Docia Chuck. Henrene Pastor , MD Age: 61 Referring MD:  Date of Birth: 1961/01/01 Gender: Female Account #: [id number] Procedure:                Upper GI endoscopy Indications:              Heme positive stool. Prior Roux-en-Y gastric bypass                            surgery Medicines:                Monitored Anesthesia Care Procedure:                Pre-Anesthesia Assessment:                           - Prior to the procedure, a History and Physical                            was performed, and patient medications and                            allergies were reviewed. The patient's tolerance of                            previous anesthesia was also reviewed. The risks                            and benefits of the procedure and the sedation                            options and risks were discussed with the patient.                            All questions were answered, and informed consent                            was obtained. Prior Anticoagulants: The patient has                            taken no previous anticoagulant or antiplatelet                            agents. ASA Grade Assessment: II - A patient with                            mild systemic disease. After reviewing the risks                            and benefits, the patient was deemed in                            satisfactory condition to undergo the procedure.  After obtaining informed consent, the endoscope was                            passed under direct vision. Throughout the                            procedure, the patient's blood pressure, pulse, and                            oxygen saturations were monitored continuously. The                            GIF [id number] #[id number] was introduced through the                            mouth, and advanced to the post anastomotic                             jejunum. The upper GI endoscopy was accomplished                            without difficulty. The patient tolerated the                            procedure well. Scope In: Scope Out: Findings:                 The esophagus was normal.                           The stomach revealed normal postoperative anatomy                            without mucosal abnormalities.                           The anastomosis was widely patent with intact                            mucosa.                           The post anastomotic jejunum was normal. Complications:            No immediate complications. Estimated Blood Loss:     Estimated blood loss: none. Impression:               Normal anatomy post Roux-en-Y gastric bypass. Recommendation:           - Patient has a contact number available for                            emergencies. The signs and symptoms of potential                            delayed complications were discussed with the  patient. Return to normal activities tomorrow.                            Written discharge instructions were provided to the                            patient.                           - Resume previous diet.                           - Continue present medications. Docia Chuck. Henrene Pastor, MD 09/18/2021 12:18:18 PM This report has been signed electronically.

## 2021-09-18 NOTE — Patient Instructions (Signed)
Handouts on polyps given to patient. Await pathology results. Resume previous diet and continue present medications. Repeat colonoscopy in 5 years for screening purposes.   YOU HAD AN ENDOSCOPIC PROCEDURE TODAY AT [facility]:   Refer to the procedure report that was given to you for any specific questions about what was found during the examination.  If the procedure report does not answer your questions, please call your gastroenterologist to clarify.  If you requested that your care partner not be given the details of your procedure findings, then the procedure report has been included in a sealed envelope for you to review at your convenience later.  YOU SHOULD EXPECT: Some feelings of bloating in the abdomen. Passage of more gas than usual.  Walking can help get rid of the air that was put into your GI tract during the procedure and reduce the bloating. If you had a lower endoscopy (such as a colonoscopy or flexible sigmoidoscopy) you may notice spotting of blood in your stool or on the toilet paper. If you underwent a bowel prep for your procedure, you may not have a normal bowel movement for a few days.  Please Note:  You might notice some irritation and congestion in your nose or some drainage.  This is from the oxygen used during your procedure.  There is no need for concern and it should clear up in a day or so.  SYMPTOMS TO REPORT IMMEDIATELY:  Following lower endoscopy (colonoscopy or flexible sigmoidoscopy):  Excessive amounts of blood in the stool  Significant tenderness or worsening of abdominal pains  Swelling of the abdomen that is new, acute  Fever of 100F or higher  Following upper endoscopy (EGD)  Vomiting of blood or coffee ground material  New chest pain or pain under the shoulder blades  Painful or persistently difficult swallowing  New shortness of breath  Fever of 100F or higher  Black, tarry-looking stools  For urgent or emergent issues, a  gastroenterologist can be reached at any hour by calling [phone]. Do not use MyChart messaging for urgent concerns.    DIET:  We do recommend a small meal at first, but then you may proceed to your regular diet.  Drink plenty of fluids but you should avoid alcoholic beverages for 24 hours.  ACTIVITY:  You should plan to take it easy for the rest of today and you should NOT DRIVE or use heavy machinery until tomorrow (because of the sedation medicines used during the test).    FOLLOW UP: Our staff will call the number listed on your records 24-72 hours following your procedure to check on you and address any questions or concerns that you may have regarding the information given to you following your procedure. If we do not reach you, we will leave a message.  We will attempt to reach you two times.  During this call, we will ask if you have developed any symptoms of COVID 19. If you develop any symptoms (ie: fever, flu-like symptoms, shortness of breath, cough etc.) before then, please call [phone].  If you test positive for Covid 19 in the 2 weeks post procedure, please call and report this information to [place].    If any biopsies were taken you will be contacted by phone or by letter within the next 1-3 weeks.  Please call us at [phone] if you have not heard about the biopsies in 3 weeks.    SIGNATURES/CONFIDENTIALITY: You and/or your care partner have  signed paperwork which will be entered into your electronic medical record.  These signatures attest to the fact that that the information above on your After Visit Summary has been reviewed and is understood.  Full responsibility of the confidentiality of this discharge information lies with you and/or your care-partner.

## 2021-09-18 NOTE — Progress Notes (Signed)
Pt in recovery with monitors in place, VSS. Report given to receiving RN. Bite guard was placed with pt awake to ensure comfort. No dental or soft tissue damage noted. 

## 2021-09-18 NOTE — Progress Notes (Signed)
HISTORY OF PRESENT ILLNESS:   [name] is a 61 y.o. female with multiple medical problems as listed below including Roux-en-Y gastric bypass surgery in 2007.  She was last seen in this office November 17, 2019 regarding GERD with breakthrough symptoms, intermittent postprandial vomiting of 5 years duration, and change in bowel habits.  She also has a history of adenomatous and sessile serrated polyps.  See that dictation for details.  Patient sent today by her primary care provider Dr. Forde Dandy regarding Hemoccult positive stool.  Outside laboratory reviewed for confirmation.  She states this was done as part of her annual physical exam.  This was collected July 31, 2021.  Patient denies melena or hematochezia.  She has been watching her diet and walking with additional weight loss.  She is pleased.  Review of outside blood work from July 09, 2021 shows unremarkable comprehensive metabolic panel.  Unremarkable CBC with hemoglobin 14.1.  Prior colonoscopy examinations 2004, 2012, 2017, 2020.  Last examination with a sessile serrated polyp.  Otherwise normal.  Follow-up in 5 years recommended.  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  No acute findings.  Abdominal ultrasound 2015.  Normal gallbladder.  Last upper endoscopy 2012 was normal status post Roux-en-Y gastric bypass.  Upper GI series 2015 unremarkable post gastric bypass surgery.  Patient tells me that she has been watching her diet.  She has less problems with vomiting, though will vomit approximately once per week.  She is also been having some issues with hoarseness for which she saw an ENT.  This was attributed to reflux disease.  In addition to pantoprazole twice daily she was placed on famotidine at night.  She thinks this may have helped some though she still has hoarseness.  She does report some constipation.  She prefers this over diarrhea, which is associated with episodes of incontinence.  Fiber did not help.    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[place])     Neurogenic bladder     Neuromuscular disorder ([place])     Nocturnal leg cramps 12/02/2017   Orthostatic hypotension 05/01/2015   Osteopenia     Pseudobulbar affect     Seizure disorder ([place])     Sleep apnea     Syncope, near 05/01/2015   Tremor             Past Surgical History:  Procedure Laterality Date   ABDOMINAL HYSTERECTOMY       CARPAL TUNNEL RELEASE Left     DEEP BRAIN STIMULATOR PLACEMENT       GASTRIC BYPASS   2005  Neck Fusion   1998   NERVE SURGERY        left ulnar nerve transposition   PANNICULECTOMY   2010   SPINAL CORD STIMULATOR IMPLANT       TONSILLECTOMY       TUBAL LIGATION          Social History [name]  reports that she has never smoked. She has never used smokeless tobacco. She reports current alcohol use. She reports that she does not use drugs.   family history includes Colon cancer in her maternal aunt and maternal uncle; Coronary artery disease in her sister; Diabetes in an other family member; Heart attack (age of onset: 66) in her father; Heart disease in her mother and another family member; Kidney disease in her mother; Other in her son; Seizures in her sister; Tremor in her mother.        Allergies  Allergen Reactions   Peanut-Containing Drug Products     Statins Other (See Comments)      Major headaches    Sulfa Antibiotics Other (See Comments)      Yeast and thrush infections with just one dose          PHYSICAL EXAMINATION: Vital signs: BP 108/62   Pulse 70   Ht '5\' 1"'$  (1.549 m)   Wt 129 lb (58.5 kg)   BMI 24.37 kg/m   Constitutional: generally well-appearing, no acute distress Psychiatric: alert and oriented x3, cooperative Eyes: extraocular movements intact, anicteric, conjunctiva pink Mouth: oral pharynx moist, no lesions Neck: supple no lymphadenopathy Cardiovascular: heart regular rate and rhythm, no murmur Lungs: clear to auscultation bilaterally Abdomen: soft, nontender, nondistended, no obvious ascites, no peritoneal signs, normal bowel sounds, no organomegaly Rectal: Deferred until colonoscopy Extremities: no clubbing, cyanosis, or lower extremity edema bilaterally Skin: no lesions on visible extremities Neuro: No focal deficits.  Cranial nerves intact   ASSESSMENT:   1.  Hemoccult positive stool.  No significant GI symptoms (no new symptoms).  Normal hemoglobin 2.  History of multiple adenomatous colon polyps and sessile serrated polyp.  Last colonoscopy 2020. 3.  Status post Roux-en-Y gastric bypass surgery 4.  Intermittent problems with nausea and vomiting.  Chronic post gastric bypass.  Better when she is careful with her diet 5.  Chronic GERD.  Classic symptoms controlled with PPI therapy 6.  Hoarseness.  ENT attributes to GERD.  Placed on at night H2 receptor antagonist in addition to twice daily PPI 7.  Multiple medical problems     PLAN:   1.  Schedule colonoscopy to evaluate Hemoccult-positive stool.The nature of the procedure, as well as the risks, benefits, and alternatives were carefully and thoroughly reviewed with the patient. Ample time for discussion and questions allowed. The patient understood, was satisfied, and agreed to proceed.  2.  Schedule upper endoscopy to evaluate Hemoccult-positive stool.The nature of the procedure, as well as the risks,  benefits, and alternatives were carefully and thoroughly reviewed with the patient. Ample time for discussion and questions allowed. The patient understood, was satisfied, and agreed to proceed.

## 2021-09-18 NOTE — Progress Notes (Signed)
VS completed by DT.  Pt's states no medical or surgical changes since previsit or office visit.  

## 2021-09-18 NOTE — Progress Notes (Signed)
Called to room to assist during endoscopic procedure.  Patient ID and intended procedure confirmed with present staff. Received instructions for my participation in the procedure from the performing physician.  

## 2021-09-18 NOTE — Op Note (Signed)
[facility] Patient Name: [name] Procedure Date: 09/18/2021 11:31 AM MRN: [id number] Endoscopist: Docia Chuck. Henrene Pastor , MD Age: 61 Referring MD:  Date of Birth: [birth year] Gender: Female Account #: [id number] Procedure:                Colonoscopy with cold snare polypectomy x 2 Indications:              High risk colon cancer surveillance: Personal                            history of multiple (3 or more) adenomas, High risk                            colon cancer surveillance: Personal history of                            sessile serrated colon polyp (less than 10 mm in                            size) with no dysplasia. Previous examinations                            2004, 2012, 2017, 2020. Also, heme positive stool Medicines:                Monitored Anesthesia Care Procedure:                Pre-Anesthesia Assessment:                           - Prior to the procedure, a History and Physical                            was performed, and patient medications and                            allergies were reviewed. The patient's tolerance of                            previous anesthesia was also reviewed. The risks                            and benefits of the procedure and the sedation                            options and risks were discussed with the patient.                            All questions were answered, and informed consent                            was obtained. Prior Anticoagulants: The patient has                            taken no previous anticoagulant or antiplatelet  agents. ASA Grade Assessment: II - A patient with                            mild systemic disease. After reviewing the risks                            and benefits, the patient was deemed in                            satisfactory condition to undergo the procedure.                           After obtaining informed consent, the colonoscope                             was passed under direct vision. Throughout the                            procedure, the patient's blood pressure, pulse, and                            oxygen saturations were monitored continuously. The                            CF [id number] #[id number] was introduced through the anus                            and advanced to the the cecum, identified by                            appendiceal orifice and ileocecal valve. The                            ileocecal valve, appendiceal orifice, and rectum                            were photographed. The quality of the bowel                            preparation was excellent. The colonoscopy was                            performed without difficulty. The patient tolerated                            the procedure well. The bowel preparation used was                            SUPREP via split dose instruction. Scope In: 11:42:53 AM Scope Out: 12:04:10 PM Scope Withdrawal Time: 0 hours 18 minutes 8 seconds  Total Procedure Duration: 0 hours 21 minutes 17 seconds  Findings:                 Two sessile polyps were found in the ascending  colon and cecum. The polyps were 1 to 4 mm in size.                            These polyps were removed with a cold snare.                            Resection and retrieval were complete.                           The exam was otherwise without abnormality on                            direct and retroflexion views. Complications:            No immediate complications. Estimated blood loss:                            None. Estimated Blood Loss:     Estimated blood loss: none. Impression:               - Two 1 to 4 mm polyps in the ascending colon and                            in the cecum, removed with a cold snare. Resected                            and retrieved.                           - The examination was otherwise normal on direct                            and  retroflexion views. Recommendation:           - Repeat colonoscopy in 5 years for surveillance.                           - Patient has a contact number available for                            emergencies. The signs and symptoms of potential                            delayed complications were discussed with the                            patient. Return to normal activities tomorrow.                            Written discharge instructions were provided to the                            patient.                           - Resume  previous diet.                           - Continue present medications.                           - Await pathology results. Docia Chuck. Henrene Pastor, MD 09/18/2021 12:14:51 PM This report has been signed electronically.

## 2021-09-19 ENCOUNTER — Telehealth: Payer: Self-pay

## 2021-09-19 NOTE — Telephone Encounter (Signed)
  Follow up Call-     09/18/2021   10:15 AM 01/10/2019    8:11 AM  Call back number  Post procedure Call Back phone  # [phone]  Permission to leave phone message Yes Yes     Patient questions:  Do you have a fever, pain , or abdominal swelling? No. Pain Score  0 *  Have you tolerated food without any problems? Yes.    Have you been able to return to your normal activities? Yes.    Do you have any questions about your discharge instructions: Diet   No. Medications  No. Follow up visit  No.  Do you have questions or concerns about your Care? No.  Actions: * If pain score is 4 or above: No action needed, pain <4.

## 2021-09-22 ENCOUNTER — Encounter: Payer: Self-pay | Admitting: Internal Medicine

## 2021-11-11 ENCOUNTER — Other Ambulatory Visit: Payer: Self-pay | Admitting: Internal Medicine

## 2022-01-08 ENCOUNTER — Other Ambulatory Visit: Payer: Self-pay | Admitting: Endocrinology

## 2022-03-06 ENCOUNTER — Other Ambulatory Visit: Payer: Self-pay | Admitting: Internal Medicine

## 2022-05-07 ENCOUNTER — Other Ambulatory Visit: Payer: Self-pay | Admitting: Internal Medicine

## 2022-08-05 ENCOUNTER — Other Ambulatory Visit: Payer: Self-pay | Admitting: Internal Medicine

## 2022-10-24 ENCOUNTER — Other Ambulatory Visit: Payer: Self-pay | Admitting: Internal Medicine

## 2022-12-16 ENCOUNTER — Other Ambulatory Visit (HOSPITAL_COMMUNITY): Payer: Self-pay | Admitting: *Deleted

## 2022-12-21 ENCOUNTER — Encounter (HOSPITAL_COMMUNITY)
Admission: RE | Admit: 2022-12-21 | Discharge: 2022-12-21 | Disposition: A | Discharge status: Home / Self Care | Payer: 59 | Source: Ambulatory Visit | Attending: Endocrinology | Admitting: Endocrinology

## 2022-12-21 DIAGNOSIS — E786 Lipoprotein deficiency: Secondary | ICD-10-CM | POA: Diagnosis not present

## 2022-12-21 DIAGNOSIS — E7801 Familial hypercholesterolemia: Secondary | ICD-10-CM | POA: Insufficient documentation

## 2022-12-21 DIAGNOSIS — Z148 Genetic carrier of other disease: Secondary | ICD-10-CM | POA: Diagnosis not present

## 2022-12-21 MED ORDER — INCLISIRAN SODIUM 284 MG/1.5ML ~~LOC~~ SOSY: 284.0000 mg | PREFILLED_SYRINGE | SUBCUTANEOUS | Status: DC

## 2022-12-21 MED ORDER — INCLISIRAN SODIUM 284 MG/1.5ML ~~LOC~~ SOSY
PREFILLED_SYRINGE | SUBCUTANEOUS | Status: AC
  Administered 2022-12-21: 284 mg via SUBCUTANEOUS
  Filled 2022-12-21: qty 1.5

## 2023-03-19 ENCOUNTER — Other Ambulatory Visit (HOSPITAL_COMMUNITY): Payer: Self-pay

## 2023-03-22 ENCOUNTER — Ambulatory Visit (HOSPITAL_COMMUNITY)
Admission: RE | Admit: 2023-03-22 | Discharge: 2023-03-22 | Disposition: A | Discharge status: Home / Self Care | Payer: 59 | Source: Ambulatory Visit | Attending: Endocrinology | Admitting: Endocrinology

## 2023-03-22 DIAGNOSIS — E7801 Familial hypercholesterolemia: Secondary | ICD-10-CM | POA: Diagnosis present

## 2023-03-22 DIAGNOSIS — Z79899 Other long term (current) drug therapy: Secondary | ICD-10-CM | POA: Insufficient documentation

## 2023-03-22 MED ORDER — INCLISIRAN SODIUM 284 MG/1.5ML ~~LOC~~ SOSY
284.0000 mg | PREFILLED_SYRINGE | Freq: Once | SUBCUTANEOUS | Status: AC
  Administered 2023-03-22: 284 mg via SUBCUTANEOUS

## 2023-03-22 MED ORDER — INCLISIRAN SODIUM 284 MG/1.5ML ~~LOC~~ SOSY
PREFILLED_SYRINGE | SUBCUTANEOUS | Status: AC
  Filled 2023-03-22: qty 1.5

## 2023-03-31 ENCOUNTER — Other Ambulatory Visit: Payer: Self-pay | Admitting: Medical Genetics

## 2023-04-21 ENCOUNTER — Other Ambulatory Visit: Payer: Self-pay | Admitting: Internal Medicine

## 2023-06-02 ENCOUNTER — Other Ambulatory Visit: Payer: Self-pay | Admitting: Internal Medicine

## 2023-09-16 ENCOUNTER — Other Ambulatory Visit (HOSPITAL_COMMUNITY)

## 2023-09-17 ENCOUNTER — Other Ambulatory Visit (HOSPITAL_COMMUNITY): Payer: Self-pay | Admitting: *Deleted

## 2023-09-17 ENCOUNTER — Telehealth: Payer: Self-pay

## 2023-09-17 NOTE — Telephone Encounter (Addendum)
 Auth Submission: APPROVED Site of care: Site of care: [facility] Payer: [employer] dual complete medicare Medication & CPT/J Code(s) submitted: Leqvio  (Inclisiran) J1306 Route of submission (phone, fax, portal):  Phone # Fax # Auth type: Buy/Bill PB Units/visits requested: 284mg  x 2 doses Reference number: [id number] Approval from: 12/04/22 to 12/04/23  Approval letter is in the media tab

## 2023-09-20 ENCOUNTER — Other Ambulatory Visit (HOSPITAL_COMMUNITY)

## 2023-09-20 ENCOUNTER — Ambulatory Visit (HOSPITAL_COMMUNITY)
Admission: RE | Admit: 2023-09-20 | Discharge: 2023-09-20 | Disposition: A | Discharge status: Home / Self Care | Payer: 59 | Source: Ambulatory Visit | Attending: Endocrinology | Admitting: Endocrinology

## 2023-09-20 DIAGNOSIS — E7801 Familial hypercholesterolemia: Secondary | ICD-10-CM | POA: Diagnosis present

## 2023-09-20 MED ORDER — INCLISIRAN SODIUM 284 MG/1.5ML ~~LOC~~ SOSY
PREFILLED_SYRINGE | SUBCUTANEOUS | Status: AC
  Filled 2023-09-20: qty 1.5

## 2023-09-20 MED ORDER — INCLISIRAN SODIUM 284 MG/1.5ML ~~LOC~~ SOSY
284.0000 mg | PREFILLED_SYRINGE | Freq: Once | SUBCUTANEOUS | Status: AC
  Administered 2023-09-20: 284 mg via SUBCUTANEOUS
  Filled 2023-09-20: qty 1.5

## 2023-09-21 ENCOUNTER — Other Ambulatory Visit (HOSPITAL_COMMUNITY)

## 2023-09-22 ENCOUNTER — Other Ambulatory Visit (HOSPITAL_COMMUNITY)

## 2023-10-17 ENCOUNTER — Other Ambulatory Visit: Payer: Self-pay | Admitting: Internal Medicine

## 2023-12-27 ENCOUNTER — Other Ambulatory Visit (HOSPITAL_COMMUNITY): Payer: Self-pay | Admitting: Endocrinology

## 2023-12-28 ENCOUNTER — Telehealth (HOSPITAL_COMMUNITY): Payer: Self-pay

## 2023-12-28 NOTE — Telephone Encounter (Signed)
 Auth Submission: PENDING Site of care: Site of care: [facility] Payer: [employer] Medicare Medication & CPT/J Code(s) submitted: Leqvio  (Inclisiran) J1306 Diagnosis Code: E78.5 Route of submission (phone, fax, portal): portal Phone # Fax # Auth type: Buy/Bill HB Units/visits requested: 284mg  q 6 months Reference number: [id number] Approval from:  to

## 2024-02-04 ENCOUNTER — Other Ambulatory Visit: Payer: Self-pay | Admitting: Medical Genetics

## 2024-02-04 DIAGNOSIS — Z006 Encounter for examination for normal comparison and control in clinical research program: Secondary | ICD-10-CM

## 2024-03-06 LAB — GENECONNECT MOLECULAR SCREEN: Genetic Analysis Overall Interpretation: NEGATIVE

## 2024-03-21 ENCOUNTER — Ambulatory Visit (HOSPITAL_COMMUNITY)
Admission: RE | Admit: 2024-03-21 | Discharge: 2024-03-21 | Disposition: A | Source: Ambulatory Visit | Attending: Endocrinology | Admitting: Endocrinology

## 2024-03-21 VITALS — BP 113/52 | HR 69 | Temp 97.5°F | Resp 16

## 2024-03-21 DIAGNOSIS — E78019 Familial hypercholesterolemia, unspecified: Secondary | ICD-10-CM | POA: Diagnosis present

## 2024-03-21 MED ORDER — INCLISIRAN SODIUM 284 MG/1.5ML ~~LOC~~ SOSY
284.0000 mg | PREFILLED_SYRINGE | Freq: Once | SUBCUTANEOUS | Status: AC
  Administered 2024-03-21: 284 mg via SUBCUTANEOUS

## 2024-05-17 ENCOUNTER — Telehealth (HOSPITAL_COMMUNITY): Payer: Self-pay

## 2024-05-17 ENCOUNTER — Encounter (HOSPITAL_COMMUNITY): Payer: Self-pay | Admitting: Endocrinology

## 2024-05-17 NOTE — Telephone Encounter (Signed)
 Auth Submission: NO AUTH NEEDED Site of care: Site of care: [facility] Payer: [employer] Medicare Medication & CPT/J Code(s) submitted: Leqvio  (Inclisiran) J1306 Diagnosis Code: E78.019 Route of submission (phone, fax, portal):  Phone # Fax # Auth type: Buy/Bill HB Units/visits requested: 284mg  q67months Reference number:  Approval from: 05/17/24 to 04/12/25

## 2024-09-20 ENCOUNTER — Encounter (HOSPITAL_COMMUNITY)
